# Patient Record
Sex: Female | Born: 1970 | Race: Black or African American | Hispanic: No | Marital: Single | Smoking: Never smoker
Health system: Southern US, Community
[De-identification: ages and names within clinical notes are randomized; demographics above are authoritative.]

## PROBLEM LIST (undated history)

## (undated) DIAGNOSIS — E119 Type 2 diabetes mellitus without complications: Secondary | ICD-10-CM

## (undated) HISTORY — PX: CHOLECYSTECTOMY: SHX55

---

## 2005-02-15 ENCOUNTER — Emergency Department: Payer: Self-pay | Admitting: Emergency Medicine

## 2005-03-24 ENCOUNTER — Emergency Department: Payer: Self-pay | Admitting: Emergency Medicine

## 2005-06-15 ENCOUNTER — Other Ambulatory Visit: Payer: Self-pay

## 2005-06-15 ENCOUNTER — Emergency Department: Payer: Self-pay | Admitting: Internal Medicine

## 2005-12-04 ENCOUNTER — Emergency Department: Payer: Self-pay | Admitting: Emergency Medicine

## 2006-01-20 ENCOUNTER — Ambulatory Visit: Payer: Self-pay | Admitting: Family Medicine

## 2006-08-22 ENCOUNTER — Emergency Department: Payer: Self-pay | Admitting: Emergency Medicine

## 2006-10-04 ENCOUNTER — Emergency Department: Payer: Self-pay | Admitting: Emergency Medicine

## 2007-11-07 ENCOUNTER — Other Ambulatory Visit: Payer: Self-pay

## 2007-11-08 ENCOUNTER — Inpatient Hospital Stay: Payer: Self-pay | Admitting: Surgery

## 2009-02-17 ENCOUNTER — Ambulatory Visit: Payer: Self-pay | Admitting: Family Medicine

## 2009-05-08 ENCOUNTER — Emergency Department: Payer: Self-pay | Admitting: Emergency Medicine

## 2009-11-09 ENCOUNTER — Emergency Department: Payer: Self-pay | Admitting: Emergency Medicine

## 2010-03-09 ENCOUNTER — Inpatient Hospital Stay: Payer: Self-pay | Admitting: Surgery

## 2010-06-24 ENCOUNTER — Emergency Department: Payer: Self-pay | Admitting: Emergency Medicine

## 2011-06-24 ENCOUNTER — Emergency Department: Payer: Self-pay | Admitting: Emergency Medicine

## 2011-06-24 LAB — CBC
HCT: 42.1 % (ref 35.0–47.0)
MCV: 81 fL (ref 80–100)
RBC: 5.22 10*6/uL — ABNORMAL HIGH (ref 3.80–5.20)
RDW: 13.1 % (ref 11.5–14.5)
WBC: 9.7 10*3/uL (ref 3.6–11.0)

## 2011-06-24 LAB — URINALYSIS, COMPLETE
Bilirubin,UR: NEGATIVE
Glucose,UR: >=500 mg/dL (ref 0–75)
Nitrite: POSITIVE
Specific Gravity: 1.021 (ref 1.003–1.030)
Squamous Epithelial: NONE SEEN
WBC UR: 740 /HPF (ref 0–5)

## 2011-06-24 LAB — COMPREHENSIVE METABOLIC PANEL
Albumin: 3.7 g/dL (ref 3.4–5.0)
Alkaline Phosphatase: 104 U/L (ref 50–136)
Anion Gap: 13 (ref 7–16)
BUN: 9 mg/dL (ref 7–18)
Calcium, Total: 9.5 mg/dL (ref 8.5–10.1)
Creatinine: 0.76 mg/dL (ref 0.60–1.30)
EGFR (Non-African Amer.): >60
Osmolality: 288 (ref 275–301)
Potassium: 3.9 mmol/L (ref 3.5–5.1)
Sodium: 139 mmol/L (ref 136–145)
Total Protein: 9.1 g/dL — ABNORMAL HIGH (ref 6.4–8.2)

## 2011-06-24 LAB — LIPASE, BLOOD: Lipase: 143 U/L (ref 73–393)

## 2012-01-20 ENCOUNTER — Emergency Department: Payer: Self-pay | Admitting: Internal Medicine

## 2015-08-03 ENCOUNTER — Emergency Department
Admission: EM | Admit: 2015-08-03 | Discharge: 2015-08-03 | Disposition: A | Discharge status: Home / Self Care | Payer: Medicaid Other | Attending: Emergency Medicine | Admitting: Emergency Medicine

## 2015-08-03 ENCOUNTER — Encounter: Payer: Self-pay | Admitting: *Deleted

## 2015-08-03 DIAGNOSIS — Z3202 Encounter for pregnancy test, result negative: Secondary | ICD-10-CM | POA: Insufficient documentation

## 2015-08-03 DIAGNOSIS — N390 Urinary tract infection, site not specified: Secondary | ICD-10-CM

## 2015-08-03 DIAGNOSIS — K2970 Gastritis, unspecified, without bleeding: Secondary | ICD-10-CM

## 2015-08-03 DIAGNOSIS — E119 Type 2 diabetes mellitus without complications: Secondary | ICD-10-CM | POA: Diagnosis not present

## 2015-08-03 DIAGNOSIS — R112 Nausea with vomiting, unspecified: Secondary | ICD-10-CM | POA: Diagnosis present

## 2015-08-03 HISTORY — DX: Type 2 diabetes mellitus without complications: E11.9

## 2015-08-03 LAB — COMPREHENSIVE METABOLIC PANEL
ALT: 13 U/L — ABNORMAL LOW (ref 14–54)
ANION GAP: 11 (ref 5–15)
AST: 25 U/L (ref 15–41)
Albumin: 3.7 g/dL (ref 3.5–5.0)
Alkaline Phosphatase: 97 U/L (ref 38–126)
BILIRUBIN TOTAL: 0.9 mg/dL (ref 0.3–1.2)
BUN: 12 mg/dL (ref 6–20)
CHLORIDE: 104 mmol/L (ref 101–111)
CO2: 20 mmol/L — ABNORMAL LOW (ref 22–32)
Calcium: 8.9 mg/dL (ref 8.9–10.3)
Creatinine, Ser: 0.75 mg/dL (ref 0.44–1.00)
Glucose, Bld: 206 mg/dL — ABNORMAL HIGH (ref 65–99)
POTASSIUM: 4.4 mmol/L (ref 3.5–5.1)
Sodium: 135 mmol/L (ref 135–145)
TOTAL PROTEIN: 8.5 g/dL — AB (ref 6.5–8.1)

## 2015-08-03 LAB — URINALYSIS COMPLETE WITH MICROSCOPIC (ARMC ONLY)
BACTERIA UA: NONE SEEN
Bilirubin Urine: NEGATIVE
GLUCOSE, UA: 150 mg/dL — AB
LEUKOCYTES UA: NEGATIVE
NITRITE: NEGATIVE
SPECIFIC GRAVITY, URINE: 1.032 — AB (ref 1.005–1.030)
pH: 5 (ref 5.0–8.0)

## 2015-08-03 LAB — CBC
HEMATOCRIT: 43.9 % (ref 35.0–47.0)
HEMOGLOBIN: 14.6 g/dL (ref 12.0–16.0)
MCH: 25.7 pg — ABNORMAL LOW (ref 26.0–34.0)
MCHC: 33.4 g/dL (ref 32.0–36.0)
MCV: 76.9 fL — AB (ref 80.0–100.0)
Platelets: 290 10*3/uL (ref 150–440)
RBC: 5.7 MIL/uL — AB (ref 3.80–5.20)
RDW: 13.7 % (ref 11.5–14.5)
WBC: 9.5 10*3/uL (ref 3.6–11.0)

## 2015-08-03 LAB — POCT PREGNANCY, URINE: PREG TEST UR: NEGATIVE

## 2015-08-03 LAB — LIPASE, BLOOD: LIPASE: 20 U/L (ref 11–51)

## 2015-08-03 MED ORDER — ONDANSETRON HCL 4 MG/2ML IJ SOLN
4.0000 mg | Freq: Once | INTRAMUSCULAR | Status: AC | PRN
  Administered 2015-08-03: 4 mg via INTRAVENOUS

## 2015-08-03 MED ORDER — SODIUM CHLORIDE 0.9 % IV BOLUS (SEPSIS)
1000.0000 mL | Freq: Once | INTRAVENOUS | Status: AC
  Administered 2015-08-03: 1000 mL via INTRAVENOUS

## 2015-08-03 MED ORDER — ONDANSETRON HCL 4 MG PO TABS: 4.0000 mg | ORAL_TABLET | Freq: Every day | ORAL | Status: DC | PRN

## 2015-08-03 MED ORDER — ONDANSETRON HCL 4 MG/2ML IJ SOLN
INTRAMUSCULAR | Status: AC
  Administered 2015-08-03: 4 mg via INTRAVENOUS
  Filled 2015-08-03: qty 2

## 2015-08-03 MED ORDER — ONDANSETRON HCL 4 MG/2ML IJ SOLN
INTRAMUSCULAR | Status: AC
  Filled 2015-08-03: qty 2

## 2015-08-03 MED ORDER — CIPROFLOXACIN HCL 500 MG PO TABS: 500.0000 mg | ORAL_TABLET | Freq: Two times a day (BID) | ORAL | Status: DC

## 2015-08-03 NOTE — ED Notes (Signed)
20 G removed fron left AC, catheter removed intact, bandage placed to control bleeding.

## 2015-08-03 NOTE — ED Notes (Signed)
Patient given gingerale and crackers per Dr. Cyril Loosen.

## 2015-08-03 NOTE — Discharge Instructions (Signed)
Gastritis, Adult °Gastritis is soreness and swelling (inflammation) of the lining of the stomach. Gastritis can develop as a sudden onset (acute) or long-term (chronic) condition. If gastritis is not treated, it can lead to stomach bleeding and ulcers. °CAUSES  °Gastritis occurs when the stomach lining is weak or damaged. Digestive juices from the stomach then inflame the weakened stomach lining. The stomach lining may be weak or damaged due to viral or bacterial infections. One common bacterial infection is the Helicobacter pylori infection. Gastritis can also result from excessive alcohol consumption, taking certain medicines, or having too much acid in the stomach.  °SYMPTOMS  °In some cases, there are no symptoms. When symptoms are present, they may include: °· Pain or a burning sensation in the upper abdomen. °· Nausea. °· Vomiting. °· An uncomfortable feeling of fullness after eating. °DIAGNOSIS  °Your caregiver may suspect you have gastritis based on your symptoms and a physical exam. To determine the cause of your gastritis, your caregiver may perform the following: °· Blood or stool tests to check for the H pylori bacterium. °· Gastroscopy. A thin, flexible tube (endoscope) is passed down the esophagus and into the stomach. The endoscope has a light and camera on the end. Your caregiver uses the endoscope to view the inside of the stomach. °· Taking a tissue sample (biopsy) from the stomach to examine under a microscope. °TREATMENT  °Depending on the cause of your gastritis, medicines may be prescribed. If you have a bacterial infection, such as an H pylori infection, antibiotics may be given. If your gastritis is caused by too much acid in the stomach, H2 blockers or antacids may be given. Your caregiver may recommend that you stop taking aspirin, ibuprofen, or other nonsteroidal anti-inflammatory drugs (NSAIDs). °HOME CARE INSTRUCTIONS °· Only take over-the-counter or prescription medicines as directed by  your caregiver. °· If you were given antibiotic medicines, take them as directed. Finish them even if you start to feel better. °· Drink enough fluids to keep your urine clear or pale yellow. °· Avoid foods and drinks that make your symptoms worse, such as: °· Caffeine or alcoholic drinks. °· Chocolate. °· Peppermint or mint flavorings. °· Garlic and onions. °· Spicy foods. °· Citrus fruits, such as oranges, lemons, or limes. °· Tomato-based foods such as sauce, chili, salsa, and pizza. °· Fried and fatty foods. °· Eat small, frequent meals instead of large meals. °SEEK IMMEDIATE MEDICAL CARE IF:  °· You have black or dark red stools. °· You vomit blood or material that looks like coffee grounds. °· You are unable to keep fluids down. °· Your abdominal pain gets worse. °· You have a fever. °· You do not feel better after 1 week. °· You have any other questions or concerns. °MAKE SURE YOU: °· Understand these instructions. °· Will watch your condition. °· Will get help right away if you are not doing well or get worse. °  °This information is not intended to replace advice given to you by your health care provider. Make sure you discuss any questions you have with your health care provider. °  °Document Released: 05/17/2001 Document Revised: 11/22/2011 Document Reviewed: 07/06/2011 °Elsevier Interactive Patient Education ©2016 [employer] ° °Urinary Tract Infection °Urinary tract infections (UTIs) can develop anywhere along your urinary tract. Your urinary tract is your body's drainage system for removing wastes and extra water. Your urinary tract includes two kidneys, two ureters, a bladder, and a urethra. Your kidneys are a pair of bean-shaped organs.   Each kidney is about the size of your fist. They are located below your ribs, one on each side of your spine. °CAUSES °Infections are caused by microbes, which are microscopic organisms, including fungi, viruses, and bacteria. These organisms are so small that they  can only be seen through a microscope. Bacteria are the microbes that most commonly cause UTIs. °SYMPTOMS  °Symptoms of UTIs may vary by age and gender of the patient and by the location of the infection. Symptoms in young women typically include a frequent and intense urge to urinate and a painful, burning feeling in the bladder or urethra during urination. Older women and men are more likely to be tired, shaky, and weak and have muscle aches and abdominal pain. A fever may mean the infection is in your kidneys. Other symptoms of a kidney infection include pain in your back or sides below the ribs, nausea, and vomiting. °DIAGNOSIS °To diagnose a UTI, your caregiver will ask you about your symptoms. Your caregiver will also ask you to provide a urine sample. The urine sample will be tested for bacteria and white blood cells. White blood cells are made by your body to help fight infection. °TREATMENT  °Typically, UTIs can be treated with medication. Because most UTIs are caused by a bacterial infection, they usually can be treated with the use of antibiotics. The choice of antibiotic and length of treatment depend on your symptoms and the type of bacteria causing your infection. °HOME CARE INSTRUCTIONS °· If you were prescribed antibiotics, take them exactly as your caregiver instructs you. Finish the medication even if you feel better after you have only taken some of the medication. °· Drink enough water and fluids to keep your urine clear or pale yellow. °· Avoid caffeine, tea, and carbonated beverages. They tend to irritate your bladder. °· Empty your bladder often. Avoid holding urine for long periods of time. °· Empty your bladder before and after sexual intercourse. °· After a bowel movement, women should cleanse from front to back. Use each tissue only once. °SEEK MEDICAL CARE IF:  °· You have back pain. °· You develop a fever. °· Your symptoms do not begin to resolve within 3 days. °SEEK IMMEDIATE MEDICAL  CARE IF:  °· You have severe back pain or lower abdominal pain. °· You develop chills. °· You have nausea or vomiting. °· You have continued burning or discomfort with urination. °MAKE SURE YOU:  °· Understand these instructions. °· Will watch your condition. °· Will get help right away if you are not doing well or get worse. °  °This information is not intended to replace advice given to you by your health care provider. Make sure you discuss any questions you have with your health care provider. °  °Document Released: 03/02/2005 Document Revised: 02/11/2015 Document Reviewed: 07/01/2011 °Elsevier Interactive Patient Education ©2016 [employer] ° °

## 2015-08-03 NOTE — ED Provider Notes (Signed)
[facility] Emergency Department Provider Note  ____________________________________________    I have reviewed the triage vital signs and the nursing notes.   HISTORY  Chief Complaint Nausea and Emesis    HPI TAKEYLA MILLION is a 45 y.o. female who presents with complaints of nausea and vomiting and mild upper abdominal cramping for the last 2 days. She denies sick contacts. No fevers or chills. She is not been able tolerate by mouth's. She denies diarrhea. No recent travel. Eating makes her symptoms worse     Past Medical History  Diagnosis Date  . Diabetes mellitus without complication (HCC)     There are no active problems to display for this patient.   History reviewed. No pertinent past surgical history.  Current Outpatient Rx  Name  Route  Sig  Dispense  Refill  . ciprofloxacin (CIPRO) 500 MG tablet   Oral   Take 1 tablet (500 mg total) by mouth 2 (two) times daily.   14 tablet   0   . ondansetron (ZOFRAN) 4 MG tablet   Oral   Take 1 tablet (4 mg total) by mouth daily as needed for nausea or vomiting.   20 tablet   1     Allergies Review of patient's allergies indicates no known allergies.  History reviewed. No pertinent family history.  Social History Social History  Substance Use Topics  . Smoking status: Never Smoker   . Smokeless tobacco: None  . Alcohol Use: None    Review of Systems  Constitutional: Negative for fever. Eyes: Negative for visual changes. ENT: Negative for sore throat Cardiovascular: Negative for chest pain. Respiratory: Negative for cough Gastrointestinal: Negative for  diarrhea. Genitourinary: Negative for dysuria. Musculoskeletal: Negative for back pain. Skin: Negative for rash. Neurological: Negative for headaches  Psychiatric: No anxiety    ____________________________________________   PHYSICAL EXAM:  VITAL SIGNS: ED Triage Vitals  Enc Vitals Group     BP 08/03/15 1804 121/74  mmHg     Pulse Rate 08/03/15 1804 102     Resp 08/03/15 1804 18     Temp 08/03/15 1804 98.6 F (37 C)     Temp Source 08/03/15 1804 Oral     SpO2 08/03/15 1804 98 %     Weight 08/03/15 1804 170 lb (77.111 kg)     Height 08/03/15 1804  (1.6 m)     Head Cir --      Peak Flow --      Pain Score 08/03/15 1814 5     Pain Loc --      Pain Edu? --      Excl. in GC? --      Constitutional: Alert and oriented. Well appearing and in no distress. Eyes: Conjunctivae are normal.  ENT   Head: Normocephalic and atraumatic.   Mouth/Throat: Mucous membranes are moist. Cardiovascular: Normal rate, regular rhythm.  Respiratory: Normal respiratory effort without tachypnea nor retractions.  Gastrointestinal: Soft and non-tender in all quadrants. No distention. There is no CVA tenderness. Genitourinary: deferred Musculoskeletal: Nontender with normal range of motion in all extremities. No lower extremity tenderness nor edema. Neurologic:  Normal speech and language. No gross focal neurologic deficits are appreciated. Skin:  Skin is warm, dry and intact. No rash noted. Psychiatric: Mood and affect are normal. Patient exhibits appropriate insight and judgment.  ____________________________________________    LABS (pertinent positives/negatives)  Labs Reviewed  COMPREHENSIVE METABOLIC PANEL - Abnormal; Notable for the following:    CO2 20 (*)  Glucose, Bld 206 (*)    Total Protein 8.5 (*)    ALT 13 (*)    All other components within normal limits  CBC - Abnormal; Notable for the following:    RBC 5.70 (*)    MCV 76.9 (*)    MCH 25.7 (*)    All other components within normal limits  URINALYSIS COMPLETEWITH MICROSCOPIC ([facility] ONLY) - Abnormal; Notable for the following:    Color, Urine AMBER (*)    APPearance CLOUDY (*)    Glucose, UA 150 (*)    Ketones, ur 1+ (*)    Specific Gravity, Urine 1.032 (*)    Hgb urine dipstick 2+ (*)    Protein, ur >500 (*)    Squamous  Epithelial / LPF 6-30 (*)    All other components within normal limits  LIPASE, BLOOD  POCT PREGNANCY, URINE    ____________________________________________   EKG  None  ____________________________________________    RADIOLOGY I have personally reviewed any xrays that were ordered on this patient: None  ____________________________________________   PROCEDURES  Procedure(s) performed: none  Critical Care performed: none  ____________________________________________   INITIAL IMPRESSION / ASSESSMENT AND PLAN / ED COURSE  Pertinent labs & imaging results that were available during my care of the patient were reviewed by me and considered in my medical decision making (see chart for details).  Patient with benign abdominal exam. Suspect gastritis as the cause of her nausea vomiting. We will give IV fluids and Zofran.  Labs are overall reassuring. Possible early urinary tract infection versus contamination. She feels much better after IV Zofran and fluids.  She is tolerating by mouth's.  Discharge with outpatient follow-up and return precautions are given    ____________________________________________   FINAL CLINICAL IMPRESSION(S) / ED DIAGNOSES  Final diagnoses:  Gastritis  UTI (lower urinary tract infection)     Jene Every, MD 08/03/15 2116

## 2015-08-03 NOTE — ED Notes (Signed)

## 2015-08-03 NOTE — ED Notes (Signed)
No vomiting noted after PO challenge.

## 2015-08-03 NOTE — ED Notes (Signed)
States nausea and vomiting since Saturday, states mid abd pain

## 2015-08-04 ENCOUNTER — Encounter: Payer: Self-pay | Admitting: Emergency Medicine

## 2015-08-04 ENCOUNTER — Emergency Department
Admission: EM | Admit: 2015-08-04 | Discharge: 2015-08-04 | Disposition: A | Discharge status: Home / Self Care | Payer: Medicaid Other | Attending: Emergency Medicine | Admitting: Emergency Medicine

## 2015-08-04 DIAGNOSIS — E119 Type 2 diabetes mellitus without complications: Secondary | ICD-10-CM | POA: Diagnosis not present

## 2015-08-04 DIAGNOSIS — R1010 Upper abdominal pain, unspecified: Secondary | ICD-10-CM | POA: Insufficient documentation

## 2015-08-04 DIAGNOSIS — R112 Nausea with vomiting, unspecified: Secondary | ICD-10-CM

## 2015-08-04 LAB — COMPREHENSIVE METABOLIC PANEL
ALBUMIN: 3.5 g/dL (ref 3.5–5.0)
ALT: 16 U/L (ref 14–54)
AST: 25 U/L (ref 15–41)
Alkaline Phosphatase: 91 U/L (ref 38–126)
Anion gap: 9 (ref 5–15)
BUN: 12 mg/dL (ref 6–20)
CHLORIDE: 104 mmol/L (ref 101–111)
CO2: 23 mmol/L (ref 22–32)
Calcium: 8.8 mg/dL — ABNORMAL LOW (ref 8.9–10.3)
Creatinine, Ser: 0.83 mg/dL (ref 0.44–1.00)
GFR calc Af Amer: >60 mL/min (ref >60)
GFR calc non Af Amer: >60 mL/min (ref >60)
GLUCOSE: 248 mg/dL — AB (ref 65–99)
POTASSIUM: 5 mmol/L (ref 3.5–5.1)
SODIUM: 136 mmol/L (ref 135–145)
Total Bilirubin: 0.7 mg/dL (ref 0.3–1.2)
Total Protein: 8.3 g/dL — ABNORMAL HIGH (ref 6.5–8.1)

## 2015-08-04 LAB — URINALYSIS COMPLETE WITH MICROSCOPIC (ARMC ONLY)
BACTERIA UA: NONE SEEN
Bilirubin Urine: NEGATIVE
GLUCOSE, UA: 50 mg/dL — AB
LEUKOCYTES UA: NEGATIVE
Nitrite: NEGATIVE
Specific Gravity, Urine: 1.032 — ABNORMAL HIGH (ref 1.005–1.030)
pH: 5 (ref 5.0–8.0)

## 2015-08-04 LAB — CBC
HEMATOCRIT: 41.3 % (ref 35.0–47.0)
Hemoglobin: 14.1 g/dL (ref 12.0–16.0)
MCH: 25.7 pg — ABNORMAL LOW (ref 26.0–34.0)
MCHC: 34.1 g/dL (ref 32.0–36.0)
MCV: 75.3 fL — AB (ref 80.0–100.0)
Platelets: 299 10*3/uL (ref 150–440)
RBC: 5.48 MIL/uL — ABNORMAL HIGH (ref 3.80–5.20)
RDW: 13.7 % (ref 11.5–14.5)
WBC: 7.5 10*3/uL (ref 3.6–11.0)

## 2015-08-04 LAB — LIPASE, BLOOD: LIPASE: 20 U/L (ref 11–51)

## 2015-08-04 MED ORDER — ONDANSETRON HCL 4 MG/2ML IJ SOLN
4.0000 mg | Freq: Once | INTRAMUSCULAR | Status: AC
  Administered 2015-08-04: 4 mg via INTRAVENOUS

## 2015-08-04 MED ORDER — PROMETHAZINE HCL 25 MG/ML IJ SOLN
12.5000 mg | Freq: Once | INTRAMUSCULAR | Status: AC
  Administered 2015-08-04: 12.5 mg via INTRAVENOUS
  Filled 2015-08-04: qty 1

## 2015-08-04 MED ORDER — SODIUM CHLORIDE 0.9 % IV BOLUS (SEPSIS)
1000.0000 mL | Freq: Once | INTRAVENOUS | Status: AC
  Administered 2015-08-04: 1000 mL via INTRAVENOUS

## 2015-08-04 MED ORDER — ONDANSETRON HCL 4 MG/2ML IJ SOLN
INTRAMUSCULAR | Status: AC
  Filled 2015-08-04: qty 2

## 2015-08-04 MED ORDER — METOCLOPRAMIDE HCL 10 MG PO TABS
10.0000 mg | ORAL_TABLET | Freq: Three times a day (TID) | ORAL | Status: DC | PRN
Start: 2015-08-04 — End: 2020-07-10

## 2015-08-04 MED ORDER — METOCLOPRAMIDE HCL 5 MG/ML IJ SOLN
10.0000 mg | Freq: Once | INTRAMUSCULAR | Status: AC
Start: 2015-08-04 — End: 2015-08-04
  Administered 2015-08-04: 10 mg via INTRAVENOUS
  Filled 2015-08-04: qty 2

## 2015-08-04 MED ORDER — INSULIN ASPART 100 UNIT/ML ~~LOC~~ SOLN
5.0000 [IU] | Freq: Once | SUBCUTANEOUS | Status: AC
  Administered 2015-08-04: 5 [IU] via SUBCUTANEOUS
  Filled 2015-08-04: qty 5

## 2015-08-04 NOTE — ED Provider Notes (Signed)
[facility] Emergency Department Provider Note     Time seen: ----------------------------------------- 8:24 PM on 08/04/2015 -----------------------------------------    I have reviewed the triage vital signs and the nursing notes.   HISTORY  Chief Complaint Nausea and Emesis    HPI Brandi Oneal is a 45 y.o. female who presents the ER for nausea and vomiting. Patient was seen here yesterday for same, diagnosed with UTI and gastroenteritis. Patient states symptoms have not really improved, Zofran was last taken at 3:30 PM. Patient states her blood sugar has been a little elevated, eating makes her symptoms worse. She's not had any diarrhea but has had upper abdominal cramping.   Past Medical History  Diagnosis Date  . Diabetes mellitus without complication (HCC)     There are no active problems to display for this patient.   History reviewed. No pertinent past surgical history.  Allergies Review of patient's allergies indicates no known allergies.  Social History Social History  Substance Use Topics  . Smoking status: Never Smoker   . Smokeless tobacco: None  . Alcohol Use: None    Review of Systems Constitutional: Negative for fever. Eyes: Negative for visual changes. ENT: Negative for sore throat. Cardiovascular: Negative for chest pain. Respiratory: Negative for shortness of breath. Gastrointestinal: Positive for abdominal pain and vomiting Genitourinary: Negative for dysuria. Musculoskeletal: Negative for back pain. Skin: Negative for rash. Neurological: Negative for headaches, focal weakness or numbness.  10-point ROS otherwise negative.  ____________________________________________   PHYSICAL EXAM:  VITAL SIGNS: ED Triage Vitals  Enc Vitals Group     BP 08/04/15 1727 143/103 mmHg     Pulse Rate 08/04/15 1727 106     Resp 08/04/15 1727 17     Temp 08/04/15 1727 98.2 F (36.8 C)     Temp Source 08/04/15 1727 Oral      SpO2 08/04/15 1727 97 %     Weight 08/04/15 1727 170 lb (77.111 kg)     Height 08/04/15 1727  (1.575 m)     Head Cir --      Peak Flow --      Pain Score 08/04/15 1731 6     Pain Loc --      Pain Edu? --      Excl. in GC? --     Constitutional: Alert and oriented. Well appearing and in no distress. Eyes: Conjunctivae are normal. PERRL. Normal extraocular movements. ENT   Head: Normocephalic and atraumatic.   Nose: No congestion/rhinnorhea.   Mouth/Throat: Mucous membranes are moist.   Neck: No stridor. Cardiovascular: Normal rate, regular rhythm. Normal and symmetric distal pulses are present in all extremities. No murmurs, rubs, or gallops. Respiratory: Normal respiratory effort without tachypnea nor retractions. Breath sounds are clear and equal bilaterally. No wheezes/rales/rhonchi. Gastrointestinal: Mild upper abdominal tenderness, no rebound or guarding. Normal bowel sounds. Musculoskeletal: Nontender with normal range of motion in all extremities. No joint effusions.  No lower extremity tenderness nor edema. Neurologic:  Normal speech and language. No gross focal neurologic deficits are appreciated. Speech is normal. No gait instability. Skin:  Skin is warm, dry and intact. No rash noted. Psychiatric: Mood and affect are normal. Speech and behavior are normal. Patient exhibits appropriate insight and judgment. ____________________________________________  ED COURSE:  Pertinent labs & imaging results that were available during my care of the patient were reviewed by me and considered in my medical decision making (see chart for details). Patient is in no acute distress, I'll recheck  a sig labs give IV fluids and antiemetics. ____________________________________________    LABS (pertinent positives/negatives)  Labs Reviewed  COMPREHENSIVE METABOLIC PANEL - Abnormal; Notable for the following:    Glucose, Bld 248 (*)    Calcium 8.8 (*)    Total Protein  8.3 (*)    All other components within normal limits  CBC - Abnormal; Notable for the following:    RBC 5.48 (*)    MCV 75.3 (*)    MCH 25.7 (*)    All other components within normal limits  URINALYSIS COMPLETEWITH MICROSCOPIC ([facility] ONLY) - Abnormal; Notable for the following:    Color, Urine AMBER (*)    APPearance HAZY (*)    Glucose, UA 50 (*)    Ketones, ur 2+ (*)    Specific Gravity, Urine 1.032 (*)    Hgb urine dipstick 1+ (*)    Protein, ur >500 (*)    Squamous Epithelial / LPF 0-5 (*)    All other components within normal limits  LIPASE, BLOOD   ____________________________________________  FINAL ASSESSMENT AND PLAN  Abdominal pain, vomiting  Plan: Patient with labs as dictated above. Patient is currently feeling better, has been able to tolerate food and drink by mouth. She'll be discharged with Reglan and encouraged to have close follow-up with her doctor for recheck.   Emily Filbert, MD   Emily Filbert, MD 08/04/15 [phone]

## 2015-08-04 NOTE — ED Notes (Signed)
C/o nausea/ vomiting.  Seen yesterday for same.  Diagnosed with UTI and gastroenteritis.  Patient states symptoms have not improved.  Zofran last taken at 1530.

## 2015-08-04 NOTE — ED Notes (Signed)
PO Challenge initiated with diet ginger ale and saltine crackers.

## 2015-08-04 NOTE — Discharge Instructions (Signed)

## 2018-10-04 ENCOUNTER — Telehealth: Payer: Self-pay | Admitting: Pharmacy Technician

## 2018-10-04 NOTE — Telephone Encounter (Signed)
Patient failed to provide requested financial documentation. Financial documentation is required in order to determine patient's eligibility for [facility]'s program. No additional medication assistance will be provided until patient provides requested financial documentation. Patient notified by letter.  Bridgett Hattabaugh CPhT/Eligibility Specialist [facility]  

## 2019-05-01 ENCOUNTER — Other Ambulatory Visit: Payer: Self-pay

## 2019-05-01 DIAGNOSIS — Z20822 Contact with and (suspected) exposure to covid-19: Secondary | ICD-10-CM

## 2019-05-02 LAB — NOVEL CORONAVIRUS, NAA: SARS-CoV-2, NAA: NOT DETECTED

## 2019-06-05 ENCOUNTER — Ambulatory Visit: Payer: Medicaid Other | Attending: Internal Medicine

## 2019-06-05 DIAGNOSIS — Z20822 Contact with and (suspected) exposure to covid-19: Secondary | ICD-10-CM

## 2019-06-07 LAB — NOVEL CORONAVIRUS, NAA: SARS-CoV-2, NAA: NOT DETECTED

## 2020-07-10 ENCOUNTER — Encounter: Payer: Self-pay | Admitting: Emergency Medicine

## 2020-07-10 ENCOUNTER — Other Ambulatory Visit: Payer: Self-pay

## 2020-07-10 ENCOUNTER — Emergency Department
Admission: EM | Admit: 2020-07-10 | Discharge: 2020-07-10 | Disposition: A | Discharge status: Home / Self Care | Payer: Medicaid Other | Attending: Emergency Medicine | Admitting: Emergency Medicine

## 2020-07-10 ENCOUNTER — Other Ambulatory Visit: Payer: Self-pay | Admitting: Family Medicine

## 2020-07-10 DIAGNOSIS — Z7982 Long term (current) use of aspirin: Secondary | ICD-10-CM | POA: Insufficient documentation

## 2020-07-10 DIAGNOSIS — N644 Mastodynia: Secondary | ICD-10-CM | POA: Diagnosis present

## 2020-07-10 DIAGNOSIS — E119 Type 2 diabetes mellitus without complications: Secondary | ICD-10-CM | POA: Insufficient documentation

## 2020-07-10 DIAGNOSIS — N6310 Unspecified lump in the right breast, unspecified quadrant: Secondary | ICD-10-CM

## 2020-07-10 MED ORDER — NAPROXEN 500 MG PO TABS: 500.0000 mg | ORAL_TABLET | Freq: Two times a day (BID) | ORAL | 0 refills | Status: DC

## 2020-07-10 NOTE — ED Notes (Signed)
See triage note  Presents with pain to right breast  States she felt a lump about 1 week ago

## 2020-07-10 NOTE — ED Triage Notes (Signed)
Pt reports about a week and a half ago she felt a lump or bump on her right breast and since then the breast has been sore.

## 2020-07-10 NOTE — ED Provider Notes (Signed)
[facility] Emergency Department Provider Note  ____________________________________________   Event Date/Time   First MD Initiated Contact with Patient 07/10/20 1005     (approximate)  I have reviewed the triage vital signs and the nursing notes.   HISTORY  Chief Complaint Breast Pain   HPI Brandi Oneal is a 50 y.o. female presents to the ED with complaint of pain to her right breast for approximately 1 week.  Patient states that she felt a "lump" 1 week ago.  This area has stayed the same in size.  She denies any discharge from her nipple, redness, heat, injury or previous breast issues.  Patient states she has never had a mammogram.  Currently she goes to [facility] and sees Dr. Letta Pate.  Currently she rates her pain as a 9 out of 10.       Past Medical History:  Diagnosis Date  . Diabetes mellitus without complication (HCC)     There are no problems to display for this patient.   History reviewed. No pertinent surgical history.  Prior to Admission medications   Medication Sig Start Date End Date Taking? Authorizing Provider  aspirin 81 MG chewable tablet Chew 81 mg by mouth daily.   Yes [provider]  atorvastatin (LIPITOR) 40 MG tablet Take 40 mg by mouth daily.   Yes [provider]  liraglutide (VICTOZA) 18 MG/3ML SOPN Inject into the skin.   Yes [provider]  lisinopril (ZESTRIL) 2.5 MG tablet Take 2.5 mg by mouth daily.   Yes [provider]  naproxen (NAPROSYN) 500 MG tablet Take 1 tablet (500 mg total) by mouth 2 (two) times daily with a meal. 07/10/20  Yes Tommi Rumps, PA-C    Allergies Patient has no known allergies.  No family history on file.  Social History Social History   Tobacco Use  . Smoking status: Never Smoker    Review of Systems Constitutional: No fever/chills Eyes: No visual changes. Cardiovascular: Denies chest pain. Respiratory: Denies shortness of  breath. Gastrointestinal: No abdominal pain.  No nausea, no vomiting.   Genitourinary: Negative for dysuria. Musculoskeletal: Negative for back pain. Skin: Negative for rash.  Positive right breast "lump". Neurological: Negative for headaches, focal weakness or numbness.  ____________________________________________   PHYSICAL EXAM:  VITAL SIGNS: ED Triage Vitals  Enc Vitals Group     BP 07/10/20 0936 (!) 153/94     Pulse Rate 07/10/20 0936 (!) 104     Resp --      Temp 07/10/20 0936 98 F (36.7 C)     Temp Source 07/10/20 0936 Oral     SpO2 07/10/20 0936 97 %     Weight 07/10/20 0936 180 lb (81.6 kg)     Height 07/10/20 0936 5\' 2"  (1.575 m)     Head Circumference --      Peak Flow --      Pain Score 07/10/20 0939 9     Pain Loc --      Pain Edu? --      Excl. in GC? --     Constitutional: Alert and oriented. Well appearing and in no acute distress. Eyes: Conjunctivae are normal.  Head: Atraumatic. Neck: No stridor.   Hematological/Lymphatic/Immunilogical: No cervical lymphadenopathy. Cardiovascular: Normal rate, regular rhythm. Grossly normal heart sounds.  Good peripheral circulation. Respiratory: Normal respiratory effort.  No retractions. Lungs CTAB. Musculoskeletal: Moves upper and lower extremities without any difficulty and normal gait was noted. Neurologic:  Normal speech and language. No gross focal neurologic deficits are appreciated.  Skin:  Skin is warm, dry and intact.  On examination of the right breast there is no gross deformity or dimpling noted.  There is no discharge from the nipple.  There is a 2 cm mobile nodule at approximately 9:00 area that is easily moved.  There are also several other nodules noted during exam again with no dimpling.  No lymphadenopathy noted to the axilla.  No skin discoloration.  Physical exam is more consistent with fibrocystic breast disease. Psychiatric: Mood and affect are normal. Speech and behavior are  normal.  ____________________________________________   LABS (all labs ordered are listed, but only abnormal results are displayed)  Labs Reviewed - No data to display   PROCEDURES  Procedure(s) performed (including Critical Care):  Procedures   ____________________________________________   INITIAL IMPRESSION / ASSESSMENT AND PLAN / ED COURSE  As part of my medical decision making, I reviewed the following data within the electronic MEDICAL RECORD NUMBER Notes from prior ED visits and  Controlled Substance Database  50 year old female presents to the ED with complaint of right breast pain and finding a lump 1 week ago.  Patient states that there has been no dimpling or discharge from her nipple.  She denies any fever, chills or warmth to her breasts.  No erythema noted on exam.  There are several mobile nodules noted during exam.  The one felt by the patient is approximately at 9 o'clock position and is easily moved.  No dimpling is noted in this area.  I spoke with [facility] about a mammography for this patient.  She will need to contact Dr. Letta Pate at [facility] to have this ordered as a diagnostic test so that results can go to his office.  Patient was given a prescription for naproxen 500 mg twice daily.  We also discussed discontinuing or decreasing the amount of caffeine that she is drinking.  There is no family history of breast cancer which is also reassuring.  ____________________________________________   FINAL CLINICAL IMPRESSION(S) / ED DIAGNOSES  Final diagnoses:  Breast pain, right     ED Discharge Orders         Ordered    naproxen (NAPROSYN) 500 MG tablet  2 times daily with meals        07/10/20 1037          *Please note:  Brandi Oneal was evaluated in Emergency Department on 07/10/2020 for the symptoms described in the history of present illness. She was evaluated in the context of the global COVID-19 pandemic, which necessitated  consideration that the patient might be at risk for infection with the SARS-CoV-2 virus that causes COVID-19. Institutional protocols and algorithms that pertain to the evaluation of patients at risk for COVID-19 are in a state of rapid change based on information released by regulatory bodies including the CDC and federal and state organizations. These policies and algorithms were followed during the patient's care in the ED.  Some ED evaluations and interventions may be delayed as a result of limited staffing during and the pandemic.*   Note:  This document was prepared using Dragon voice recognition software and may include unintentional dictation errors.    Tommi Rumps, PA-C 07/10/20 1333    Gilles Chiquito, MD 07/10/20 [phone]

## 2020-07-10 NOTE — Discharge Instructions (Addendum)
Call your primary care provider to get an order for mammogram.  They will then send the results to your primary care provider.  In the meantime decrease the amount of caffeine that you are drinking and also start naproxen 5 mg twice daily with food.

## 2020-07-13 ENCOUNTER — Other Ambulatory Visit: Payer: Self-pay | Admitting: Family Medicine

## 2020-07-13 DIAGNOSIS — N6310 Unspecified lump in the right breast, unspecified quadrant: Secondary | ICD-10-CM

## 2020-07-22 ENCOUNTER — Other Ambulatory Visit: Payer: Self-pay

## 2020-07-22 ENCOUNTER — Ambulatory Visit
Admission: RE | Admit: 2020-07-22 | Discharge: 2020-07-22 | Disposition: A | Discharge status: Home / Self Care | Payer: Medicaid Other | Source: Ambulatory Visit | Attending: Family Medicine | Admitting: Family Medicine

## 2020-07-22 DIAGNOSIS — N6310 Unspecified lump in the right breast, unspecified quadrant: Secondary | ICD-10-CM

## 2020-07-22 DIAGNOSIS — N6311 Unspecified lump in the right breast, upper outer quadrant: Secondary | ICD-10-CM | POA: Insufficient documentation

## 2020-12-01 ENCOUNTER — Emergency Department: Payer: Medicare Other

## 2020-12-01 ENCOUNTER — Encounter: Payer: Self-pay | Admitting: Emergency Medicine

## 2020-12-01 ENCOUNTER — Inpatient Hospital Stay
Admission: EM | Admit: 2020-12-01 | Discharge: 2020-12-04 | DRG: 440 | Disposition: A | Discharge status: Home / Self Care | Payer: Medicare Other | Attending: Internal Medicine | Admitting: Internal Medicine

## 2020-12-01 ENCOUNTER — Other Ambulatory Visit: Payer: Self-pay

## 2020-12-01 DIAGNOSIS — Z7982 Long term (current) use of aspirin: Secondary | ICD-10-CM

## 2020-12-01 DIAGNOSIS — K859 Acute pancreatitis without necrosis or infection, unspecified: Secondary | ICD-10-CM | POA: Diagnosis not present

## 2020-12-01 DIAGNOSIS — K7689 Other specified diseases of liver: Secondary | ICD-10-CM | POA: Diagnosis present

## 2020-12-01 DIAGNOSIS — Z79899 Other long term (current) drug therapy: Secondary | ICD-10-CM

## 2020-12-01 DIAGNOSIS — E119 Type 2 diabetes mellitus without complications: Secondary | ICD-10-CM

## 2020-12-01 DIAGNOSIS — Z794 Long term (current) use of insulin: Secondary | ICD-10-CM

## 2020-12-01 DIAGNOSIS — Z20822 Contact with and (suspected) exposure to covid-19: Secondary | ICD-10-CM | POA: Diagnosis present

## 2020-12-01 DIAGNOSIS — D72829 Elevated white blood cell count, unspecified: Secondary | ICD-10-CM | POA: Diagnosis present

## 2020-12-01 DIAGNOSIS — R739 Hyperglycemia, unspecified: Secondary | ICD-10-CM | POA: Diagnosis present

## 2020-12-01 DIAGNOSIS — R000 Tachycardia, unspecified: Secondary | ICD-10-CM | POA: Diagnosis present

## 2020-12-01 DIAGNOSIS — E860 Dehydration: Secondary | ICD-10-CM | POA: Diagnosis present

## 2020-12-01 DIAGNOSIS — I10 Essential (primary) hypertension: Secondary | ICD-10-CM | POA: Diagnosis present

## 2020-12-01 DIAGNOSIS — R1013 Epigastric pain: Secondary | ICD-10-CM | POA: Diagnosis present

## 2020-12-01 DIAGNOSIS — R1011 Right upper quadrant pain: Secondary | ICD-10-CM

## 2020-12-01 DIAGNOSIS — D509 Iron deficiency anemia, unspecified: Secondary | ICD-10-CM | POA: Diagnosis present

## 2020-12-01 DIAGNOSIS — E1165 Type 2 diabetes mellitus with hyperglycemia: Secondary | ICD-10-CM | POA: Diagnosis present

## 2020-12-01 DIAGNOSIS — G43909 Migraine, unspecified, not intractable, without status migrainosus: Secondary | ICD-10-CM | POA: Diagnosis present

## 2020-12-01 DIAGNOSIS — Z9049 Acquired absence of other specified parts of digestive tract: Secondary | ICD-10-CM

## 2020-12-01 DIAGNOSIS — K8500 Idiopathic acute pancreatitis without necrosis or infection: Secondary | ICD-10-CM

## 2020-12-01 LAB — CBG MONITORING, ED: Glucose-Capillary: 416 mg/dL — ABNORMAL HIGH (ref 70–99)

## 2020-12-01 LAB — URINALYSIS, COMPLETE (UACMP) WITH MICROSCOPIC
Bilirubin Urine: NEGATIVE
Glucose, UA: >=500 mg/dL — AB
Ketones, ur: NEGATIVE mg/dL
Leukocytes,Ua: NEGATIVE
Nitrite: NEGATIVE
Protein, ur: 100 mg/dL — AB
Specific Gravity, Urine: 1.036 — ABNORMAL HIGH (ref 1.005–1.030)
pH: 6 (ref 5.0–8.0)

## 2020-12-01 LAB — COMPREHENSIVE METABOLIC PANEL
ALT: 12 U/L (ref 0–44)
AST: 14 U/L — ABNORMAL LOW (ref 15–41)
Albumin: 3.5 g/dL (ref 3.5–5.0)
Alkaline Phosphatase: 92 U/L (ref 38–126)
Anion gap: 5 (ref 5–15)
BUN: 12 mg/dL (ref 6–20)
CO2: 24 mmol/L (ref 22–32)
Calcium: 8.7 mg/dL — ABNORMAL LOW (ref 8.9–10.3)
Chloride: 102 mmol/L (ref 98–111)
Creatinine, Ser: 0.8 mg/dL (ref 0.44–1.00)
GFR, Estimated: >60 mL/min (ref >60)
Glucose, Bld: 453 mg/dL — ABNORMAL HIGH (ref 70–99)
Potassium: 4 mmol/L (ref 3.5–5.1)
Sodium: 131 mmol/L — ABNORMAL LOW (ref 135–145)
Total Bilirubin: 0.5 mg/dL (ref 0.3–1.2)
Total Protein: 7.7 g/dL (ref 6.5–8.1)

## 2020-12-01 LAB — CBC
HCT: 33.8 % — ABNORMAL LOW (ref 36.0–46.0)
Hemoglobin: 12.2 g/dL (ref 12.0–15.0)
MCH: 26.8 pg (ref 26.0–34.0)
MCHC: 36.1 g/dL — ABNORMAL HIGH (ref 30.0–36.0)
MCV: 74.1 fL — ABNORMAL LOW (ref 80.0–100.0)
Platelets: 313 10*3/uL (ref 150–400)
RBC: 4.56 MIL/uL (ref 3.87–5.11)
RDW: 12.4 % (ref 11.5–15.5)
WBC: 10.7 10*3/uL — ABNORMAL HIGH (ref 4.0–10.5)
nRBC: 0 % (ref 0.0–0.2)

## 2020-12-01 LAB — POC URINE PREG, ED: Preg Test, Ur: NEGATIVE

## 2020-12-01 LAB — LIPASE, BLOOD: Lipase: 764 U/L — ABNORMAL HIGH (ref 11–51)

## 2020-12-01 MED ORDER — MORPHINE SULFATE (PF) 4 MG/ML IV SOLN
4.0000 mg | Freq: Once | INTRAVENOUS | Status: AC
  Administered 2020-12-01: 23:00:00 4 mg via INTRAVENOUS
  Filled 2020-12-01: qty 1

## 2020-12-01 MED ORDER — IOHEXOL 300 MG/ML  SOLN
100.0000 mL | Freq: Once | INTRAMUSCULAR | Status: AC | PRN
  Administered 2020-12-01: 23:00:00 100 mL via INTRAVENOUS

## 2020-12-01 MED ORDER — LACTATED RINGERS IV BOLUS
1000.0000 mL | Freq: Once | INTRAVENOUS | Status: AC
  Administered 2020-12-01: 23:00:00 1000 mL via INTRAVENOUS

## 2020-12-01 NOTE — ED Triage Notes (Signed)
Pt arrived via POV with reports of abd pain since Saturday, pt states the pain has not changed, but states she cannot wait for her doctor's appt.   Pt reports generalized upper abd pain. Pt reports she has hx of similar pain and was told it was her gallbladder. Pt states she has not been vomiting this time.

## 2020-12-01 NOTE — ED Provider Notes (Signed)
[facility] Emergency Department Provider Note ____________________________________________   Event Date/Time   First MD Initiated Contact with Patient 12/01/20 2148     (approximate)  I have reviewed the triage vital signs and the nursing notes.  HISTORY  Chief Complaint Abdominal Pain   HPI Brandi Oneal is a 50 y.o. femalewho presents to the ED for evaluation of abd pain.   Chart review indicates no relevant history.  Patient presents to the ED for evaluation of 3 days of periumbilical abdominal pain with associated nausea.  She reports radiation of the pain towards her back.  She reports severe 10/10 pain.  She reports the pain is worse at night when she was trying to sleep and laying in bed.  Denies any fevers, stool changes, dysuria, syncopal episodes or substernal chest pain.  Patient reports that she drinks no alcohol, no recreational drugs.  She is s/p cholecystectomy.  This has never happened before.  Past Medical History:  Diagnosis Date   Diabetes mellitus without complication (HCC)     There are no problems to display for this patient.   History reviewed. No pertinent surgical history.  Prior to Admission medications   Medication Sig Start Date End Date Taking? Authorizing Provider  aspirin 81 MG chewable tablet Chew 81 mg by mouth daily.    [provider]  atorvastatin (LIPITOR) 40 MG tablet Take 40 mg by mouth daily.    [provider]  liraglutide (VICTOZA) 18 MG/3ML SOPN Inject into the skin.    [provider]  lisinopril (ZESTRIL) 2.5 MG tablet Take 2.5 mg by mouth daily.    [provider]  naproxen (NAPROSYN) 500 MG tablet Take 1 tablet (500 mg total) by mouth 2 (two) times daily with a meal. 07/10/20   Tommi Rumps, PA-C    Allergies Patient has no known allergies.  No family history on file.  Social History Social History   Tobacco Use   Smoking status: Never    Passive  exposure: Never   Smokeless tobacco: Never  Vaping Use   Vaping Use: Never used  Substance Use Topics   Alcohol use: Not Currently    Review of Systems  Constitutional: No fever/chills Eyes: No visual changes. ENT: No sore throat. Cardiovascular: Denies chest pain. Respiratory: Denies shortness of breath. Gastrointestinal: Positive for abdominal pain and nausea no vomiting.  No diarrhea.  No constipation. Genitourinary: Negative for dysuria. Musculoskeletal: Negative for back pain. Skin: Negative for rash. Neurological: Negative for headaches, focal weakness or numbness.  ____________________________________________   PHYSICAL EXAM:  VITAL SIGNS: Vitals:   12/01/20 2200 12/01/20 2300  BP: (!) 145/73 (!) 157/88  Pulse: 96 89  Resp: 17 16  Temp:    SpO2: 100% 100%     Constitutional: Alert and oriented.  Appears uncomfortable, but in no acute distress. Eyes: Conjunctivae are normal. PERRL. EOMI. Head: Atraumatic. Nose: No congestion/rhinnorhea. Mouth/Throat: Mucous membranes are moist.  Oropharynx non-erythematous. Neck: No stridor. No cervical spine tenderness to palpation. Cardiovascular: Normal rate, regular rhythm. Grossly normal heart sounds.  Good peripheral circulation. Respiratory: Normal respiratory effort.  No retractions. Lungs CTAB. Gastrointestinal: Soft , nondistended. No CVA tenderness. Periumbilical and epigastric tenderness to palpation with voluntary guarding.  Lower abdomen is benign. Musculoskeletal: No lower extremity tenderness nor edema.  No joint effusions. No signs of acute trauma. Neurologic:  Normal speech and language. No gross focal neurologic deficits are appreciated. No gait instability noted. Skin:  Skin is warm, dry  and intact. No rash noted. Psychiatric: Mood and affect are normal. Speech and behavior are normal.  ____________________________________________   LABS (all labs ordered are listed, but only abnormal results are  displayed)  Labs Reviewed  LIPASE, BLOOD - Abnormal; Notable for the following components:      Result Value   Lipase 764 (*)    All other components within normal limits  COMPREHENSIVE METABOLIC PANEL - Abnormal; Notable for the following components:   Sodium 131 (*)    Glucose, Bld 453 (*)    Calcium 8.7 (*)    AST 14 (*)    All other components within normal limits  CBC - Abnormal; Notable for the following components:   WBC 10.7 (*)    HCT 33.8 (*)    MCV 74.1 (*)    MCHC 36.1 (*)    All other components within normal limits  URINALYSIS, COMPLETE (UACMP) WITH MICROSCOPIC - Abnormal; Notable for the following components:   Color, Urine YELLOW (*)    APPearance CLEAR (*)    Specific Gravity, Urine 1.036 (*)    Glucose, UA >=500 (*)    Hgb urine dipstick SMALL (*)    Protein, ur 100 (*)    Bacteria, UA RARE (*)    All other components within normal limits  CBG MONITORING, ED - Abnormal; Notable for the following components:   Glucose-Capillary 416 (*)    All other components within normal limits  SARS CORONAVIRUS 2 (TAT 6-24 HRS)  POC URINE PREG, ED   ____________________________________________  12 Lead EKG  Sinus tachycardia, rate of 101 bpm.  Normal axis and intervals.  No evidence of acute ischemia. ____________________________________________  RADIOLOGY  ED MD interpretation: RUQ ultrasound reviewed by me without evidence of biliary pathology  Official radiology report(s): US Abdomen Limited RUQ (LIVER/GB)  Result Date: 12/01/2020 CLINICAL DATA:  Right upper quadrant pain x3 days. Prior cholecystectomy. EXAM: ULTRASOUND ABDOMEN LIMITED RIGHT UPPER QUADRANT COMPARISON:  None. FINDINGS: Gallbladder: The gallbladder is surgically absent. Common bile duct: Diameter: 7.3 mm Liver: A 2.7 cm x 3.2 cm x 2.8 cm anechoic structure is seen within the right lobe of the liver. This is adjacent to the gallbladder fossa. No abnormal flow is seen within this region on color  Doppler evaluation. Within normal limits in parenchymal echogenicity. Portal vein is patent on color Doppler imaging with normal direction of blood flow towards the liver. Other: None. IMPRESSION: 1. Evidence of prior cholecystectomy. 2. Large simple hepatic cyst. Electronically Signed   By: Aram Candela M.D.   On: 12/01/2020 22:41    ____________________________________________   PROCEDURES and INTERVENTIONS  Procedure(s) performed (including Critical Care):  .1-3 Lead EKG Interpretation  Date/Time: 12/01/2020 10:26 PM Performed by: Delton Prairie, MD Authorized by: Delton Prairie, MD     Interpretation: normal     ECG rate:  94   ECG rate assessment: normal     Rhythm: sinus rhythm     Ectopy: none     Conduction: normal    Medications  lactated ringers bolus 1,000 mL (1,000 mLs Intravenous New Bag/Given 12/01/20 2303)  morphine 4 MG/ML injection 4 mg (4 mg Intravenous Given 12/01/20 2304)  iohexol (OMNIPAQUE) 300 MG/ML solution 100 mL (100 mLs Intravenous Contrast Given 12/01/20 2312)    ____________________________________________   MDM / ED COURSE   50 year old woman presents to the ED with first-time episode of idiopathic pancreatitis requiring admission for pain control.  Normal vitals.  Exam with tenderness without peritoneal features.  Blood  work with elevated lipase to the 700s.  She is status post cholecystectomy and denies ethanol intake, so I suspect idiopathic acute pancreatitis.  We will obtain CT scan to assess for complications such as pseudocyst.  Considering her poorly controlled pain we will plan for medical observation admission.  Patient signed out to oncoming divider pending CT abdomen/pelvis to assess for associated complications from her idiopathic acute pancreatitis.  Anticipate admission thereafter.    ____________________________________________   FINAL CLINICAL IMPRESSION(S) / ED DIAGNOSES  Final diagnoses:  RUQ pain  Idiopathic acute  pancreatitis without infection or necrosis     ED Discharge Orders     None        Merrit Friesen   Note:  This document was prepared using Dragon voice recognition software and may include unintentional dictation errors.    Delton Prairie, MD 12/01/20 [phone]

## 2020-12-02 ENCOUNTER — Encounter: Payer: Self-pay | Admitting: Internal Medicine

## 2020-12-02 DIAGNOSIS — Z20822 Contact with and (suspected) exposure to covid-19: Secondary | ICD-10-CM | POA: Diagnosis present

## 2020-12-02 DIAGNOSIS — K859 Acute pancreatitis without necrosis or infection, unspecified: Secondary | ICD-10-CM | POA: Diagnosis present

## 2020-12-02 DIAGNOSIS — E119 Type 2 diabetes mellitus without complications: Secondary | ICD-10-CM

## 2020-12-02 DIAGNOSIS — K858 Other acute pancreatitis without necrosis or infection: Secondary | ICD-10-CM | POA: Diagnosis not present

## 2020-12-02 DIAGNOSIS — K8500 Idiopathic acute pancreatitis without necrosis or infection: Secondary | ICD-10-CM | POA: Diagnosis not present

## 2020-12-02 DIAGNOSIS — I10 Essential (primary) hypertension: Secondary | ICD-10-CM | POA: Diagnosis present

## 2020-12-02 DIAGNOSIS — R1011 Right upper quadrant pain: Secondary | ICD-10-CM | POA: Diagnosis not present

## 2020-12-02 DIAGNOSIS — D72829 Elevated white blood cell count, unspecified: Secondary | ICD-10-CM

## 2020-12-02 DIAGNOSIS — Z9049 Acquired absence of other specified parts of digestive tract: Secondary | ICD-10-CM | POA: Diagnosis not present

## 2020-12-02 DIAGNOSIS — D509 Iron deficiency anemia, unspecified: Secondary | ICD-10-CM | POA: Diagnosis present

## 2020-12-02 DIAGNOSIS — G43909 Migraine, unspecified, not intractable, without status migrainosus: Secondary | ICD-10-CM | POA: Diagnosis present

## 2020-12-02 DIAGNOSIS — R1013 Epigastric pain: Secondary | ICD-10-CM

## 2020-12-02 DIAGNOSIS — Z794 Long term (current) use of insulin: Secondary | ICD-10-CM | POA: Diagnosis not present

## 2020-12-02 DIAGNOSIS — E860 Dehydration: Secondary | ICD-10-CM

## 2020-12-02 DIAGNOSIS — R000 Tachycardia, unspecified: Secondary | ICD-10-CM | POA: Diagnosis present

## 2020-12-02 DIAGNOSIS — Z79899 Other long term (current) drug therapy: Secondary | ICD-10-CM | POA: Diagnosis not present

## 2020-12-02 DIAGNOSIS — K7689 Other specified diseases of liver: Secondary | ICD-10-CM | POA: Diagnosis present

## 2020-12-02 DIAGNOSIS — E1165 Type 2 diabetes mellitus with hyperglycemia: Secondary | ICD-10-CM | POA: Diagnosis present

## 2020-12-02 DIAGNOSIS — R739 Hyperglycemia, unspecified: Secondary | ICD-10-CM

## 2020-12-02 DIAGNOSIS — Z7982 Long term (current) use of aspirin: Secondary | ICD-10-CM | POA: Diagnosis not present

## 2020-12-02 DIAGNOSIS — E1169 Type 2 diabetes mellitus with other specified complication: Secondary | ICD-10-CM | POA: Diagnosis not present

## 2020-12-02 LAB — URINE DRUG SCREEN, QUALITATIVE (ARMC ONLY)
Amphetamines, Ur Screen: NOT DETECTED
Barbiturates, Ur Screen: NOT DETECTED
Benzodiazepine, Ur Scrn: NOT DETECTED
Cannabinoid 50 Ng, Ur ~~LOC~~: NOT DETECTED
Cocaine Metabolite,Ur ~~LOC~~: NOT DETECTED
MDMA (Ecstasy)Ur Screen: NOT DETECTED
Methadone Scn, Ur: NOT DETECTED
Opiate, Ur Screen: NOT DETECTED
Phencyclidine (PCP) Ur S: NOT DETECTED
Tricyclic, Ur Screen: NOT DETECTED

## 2020-12-02 LAB — COMPREHENSIVE METABOLIC PANEL
ALT: 12 U/L (ref 0–44)
AST: 13 U/L — ABNORMAL LOW (ref 15–41)
Albumin: 3.1 g/dL — ABNORMAL LOW (ref 3.5–5.0)
Alkaline Phosphatase: 83 U/L (ref 38–126)
Anion gap: 3 — ABNORMAL LOW (ref 5–15)
BUN: 11 mg/dL (ref 6–20)
CO2: 29 mmol/L (ref 22–32)
Calcium: 8.5 mg/dL — ABNORMAL LOW (ref 8.9–10.3)
Chloride: 102 mmol/L (ref 98–111)
Creatinine, Ser: 0.72 mg/dL (ref 0.44–1.00)
GFR, Estimated: >60 mL/min (ref >60)
Glucose, Bld: 352 mg/dL — ABNORMAL HIGH (ref 70–99)
Potassium: 3.6 mmol/L (ref 3.5–5.1)
Sodium: 134 mmol/L — ABNORMAL LOW (ref 135–145)
Total Bilirubin: 0.4 mg/dL (ref 0.3–1.2)
Total Protein: 7.3 g/dL (ref 6.5–8.1)

## 2020-12-02 LAB — SARS CORONAVIRUS 2 (TAT 6-24 HRS): SARS Coronavirus 2: NEGATIVE

## 2020-12-02 LAB — MAGNESIUM
Magnesium: 1.7 mg/dL (ref 1.7–2.4)
Magnesium: 1.7 mg/dL (ref 1.7–2.4)

## 2020-12-02 LAB — CBC
HCT: 31.5 % — ABNORMAL LOW (ref 36.0–46.0)
Hemoglobin: 11.2 g/dL — ABNORMAL LOW (ref 12.0–15.0)
MCH: 26.7 pg (ref 26.0–34.0)
MCHC: 35.6 g/dL (ref 30.0–36.0)
MCV: 75.2 fL — ABNORMAL LOW (ref 80.0–100.0)
Platelets: 288 10*3/uL (ref 150–400)
RBC: 4.19 MIL/uL (ref 3.87–5.11)
RDW: 12.7 % (ref 11.5–15.5)
WBC: 11 10*3/uL — ABNORMAL HIGH (ref 4.0–10.5)
nRBC: 0 % (ref 0.0–0.2)

## 2020-12-02 LAB — CBG MONITORING, ED
Glucose-Capillary: 328 mg/dL — ABNORMAL HIGH (ref 70–99)
Glucose-Capillary: 287 mg/dL — ABNORMAL HIGH (ref 70–99)
Glucose-Capillary: 191 mg/dL — ABNORMAL HIGH (ref 70–99)
Glucose-Capillary: 181 mg/dL — ABNORMAL HIGH (ref 70–99)

## 2020-12-02 LAB — GLUCOSE, CAPILLARY: Glucose-Capillary: 173 mg/dL — ABNORMAL HIGH (ref 70–99)

## 2020-12-02 LAB — HIV ANTIBODY (ROUTINE TESTING W REFLEX): HIV Screen 4th Generation wRfx: NONREACTIVE

## 2020-12-02 LAB — PHOSPHORUS: Phosphorus: 2.4 mg/dL — ABNORMAL LOW (ref 2.5–4.6)

## 2020-12-02 LAB — TRIGLYCERIDES: Triglycerides: 158 mg/dL — ABNORMAL HIGH (ref <150)

## 2020-12-02 LAB — ETHANOL: Alcohol, Ethyl (B): <10 mg/dL (ref <10)

## 2020-12-02 MED ORDER — LACTATED RINGERS IV SOLN
INTRAVENOUS | Status: DC
  Administered 2020-12-02: 02:00:00 150 mL via INTRAVENOUS

## 2020-12-02 MED ORDER — ONDANSETRON HCL 4 MG/2ML IJ SOLN
4.0000 mg | Freq: Four times a day (QID) | INTRAMUSCULAR | Status: DC | PRN
  Administered 2020-12-02 (×3): 4 mg via INTRAVENOUS
  Filled 2020-12-02 (×3): qty 2

## 2020-12-02 MED ORDER — ENOXAPARIN SODIUM 40 MG/0.4ML IJ SOSY
40.0000 mg | PREFILLED_SYRINGE | INTRAMUSCULAR | Status: DC
  Administered 2020-12-02 – 2020-12-03 (×2): 40 mg via SUBCUTANEOUS
  Filled 2020-12-02 (×2): qty 0.4

## 2020-12-02 MED ORDER — NALOXONE HCL 2 MG/2ML IJ SOSY
0.4000 mg | PREFILLED_SYRINGE | INTRAMUSCULAR | Status: DC | PRN
  Filled 2020-12-02: qty 2

## 2020-12-02 MED ORDER — MORPHINE SULFATE (PF) 4 MG/ML IV SOLN
4.0000 mg | INTRAVENOUS | Status: DC | PRN
  Administered 2020-12-02 (×3): 4 mg via INTRAVENOUS
  Filled 2020-12-02 (×3): qty 1

## 2020-12-02 MED ORDER — LACTATED RINGERS IV BOLUS
1000.0000 mL | Freq: Once | INTRAVENOUS | Status: AC
  Administered 2020-12-02: 02:00:00 1000 mL via INTRAVENOUS

## 2020-12-02 MED ORDER — ACETAMINOPHEN 650 MG RE SUPP: 650.0000 mg | Freq: Four times a day (QID) | RECTAL | Status: DC | PRN

## 2020-12-02 MED ORDER — MORPHINE SULFATE (PF) 4 MG/ML IV SOLN
4.0000 mg | Freq: Once | INTRAVENOUS | Status: AC
Start: 2020-12-02 — End: 2020-12-02
  Administered 2020-12-02: 02:00:00 4 mg via INTRAVENOUS
  Filled 2020-12-02: qty 1

## 2020-12-02 MED ORDER — INSULIN GLARGINE 100 UNIT/ML ~~LOC~~ SOLN
12.0000 [IU] | Freq: Two times a day (BID) | SUBCUTANEOUS | Status: DC
  Administered 2020-12-02 – 2020-12-04 (×4): 12 [IU] via SUBCUTANEOUS
  Filled 2020-12-02 (×7): qty 0.12

## 2020-12-02 MED ORDER — LACTATED RINGERS IV SOLN: INTRAVENOUS | Status: DC

## 2020-12-02 MED ORDER — SCOPOLAMINE 1 MG/3DAYS TD PT72
1.0000 | MEDICATED_PATCH | TRANSDERMAL | Status: DC
  Administered 2020-12-02: 1.5 mg via TRANSDERMAL
  Filled 2020-12-02: qty 1

## 2020-12-02 MED ORDER — SODIUM CHLORIDE 0.9 % IV SOLN
6.2500 mg | Freq: Four times a day (QID) | INTRAVENOUS | Status: DC | PRN
  Administered 2020-12-02 – 2020-12-03 (×2): 6.25 mg via INTRAVENOUS
  Filled 2020-12-02 (×5): qty 0.25

## 2020-12-02 MED ORDER — INSULIN ASPART 100 UNIT/ML IJ SOLN
0.0000 [IU] | Freq: Four times a day (QID) | INTRAMUSCULAR | Status: DC
  Administered 2020-12-02: 7 [IU] via SUBCUTANEOUS
  Administered 2020-12-02: 2 [IU] via SUBCUTANEOUS
  Administered 2020-12-02: 5 [IU] via SUBCUTANEOUS
  Administered 2020-12-03: 17:00:00 3 [IU] via SUBCUTANEOUS
  Administered 2020-12-03: 12:00:00 2 [IU] via SUBCUTANEOUS
  Administered 2020-12-04: 13:00:00 3 [IU] via SUBCUTANEOUS
  Filled 2020-12-02 (×6): qty 1

## 2020-12-02 MED ORDER — ACETAMINOPHEN 325 MG PO TABS: 650.0000 mg | ORAL_TABLET | Freq: Four times a day (QID) | ORAL | Status: DC | PRN

## 2020-12-02 NOTE — ED Provider Notes (Signed)
  Physical Exam  BP (!) 157/88   Pulse 89   Temp 98.6 F (37 C) (Oral)   Resp 16   Ht 5\' 2"  (1.575 m)   Wt 79.8 kg   LMP 11/10/2020 (Exact Date)   SpO2 100%   BMI 32.19 kg/m   Physical Exam  ED Course/Procedures     Procedures  MDM  11:30 PM  Assumed care of patient at shift change.  Patient here with idiopathic pancreatitis.  CT scan pending for further evaluation.  12:05 AM  Pt's CT scan shows acute pancreatitis without abscess, pseudocyst, necrosis.  She has had previous cholecystectomy.  LFTs normal.  Will discuss with hospitalist for admission.   12:21 AM Discussed patient's case with hospitalist, Dr. 01/10/2021.  I have recommended admission and patient (and family if present) agree with this plan. Admitting physician will place admission orders.   I reviewed all nursing notes, vitals, pertinent previous records and reviewed/interpreted all EKGs, lab and urine results, imaging (as available).       Seletha Zimmermann, Arlean Hopping, DO 12/02/20 0021

## 2020-12-02 NOTE — ED Notes (Signed)
Transport requested

## 2020-12-02 NOTE — H&P (Signed)
History and Physical    PLEASE NOTE THAT DRAGON DICTATION SOFTWARE WAS USED IN THE CONSTRUCTION OF THIS NOTE.   Brandi Oneal JAS:[id number] DOB: 1970/12/09 DOA: 12/01/2020  PCP: Emogene Morgan, MD Patient coming from: home   I have personally briefly reviewed patient's old medical records in [facility]  Chief Complaint: Abdominal pain  HPI: Brandi Oneal is a 50 y.o. female with medical history significant for type 2 diabetes mellitus, hypertension, who is admitted to [facility] on 12/01/2020 with acute pancreatitis after presenting from home to [facility] ED complaining of abdominal pain.   The patient reports 3 days of progressive epigastric pain radiating into the right upper quadrant.  She describes the pain as sharp in nature, nearly constant over the last 3 days, and worsening with palpation of the abdomen.  She notes significant decline in oral intake over that timeframe as well.  She notes associated nausea resulting in 2-3 episodes nonbloody, nonbilious emesis, with most recent episode of vomiting occurring 3 to 4 days ago, and, per the patient, preceding the onset of abdominal pain.  While she notes residual intermittent nausea following the onset of her abdominal pain, she denies any ensuing episodes of vomiting.  Denies any associated recent diarrhea, melena or hematochezia.  She also denies any associated subjective fever, chills, rigors, or generalized myalgias.  No recent trauma or travel.  She notes experiencing a "migraine" approximately 5 to 6 days ago, for which she took a single dose of Aleve.  She notes ensuing resolution of the associated headache.   Denies any prior history of acute pancreatitis.  Denies any regular or recent alcohol consumption.  She also conveys a history of cholecystectomy but denies any recent abdominal surgery or ERCP.  Denies any recent dysuria, gross materia, change in urinary urgency/frequency.  No recent jaundice appearance.   In terms of medications that can be associated with acute pancreatitis, patient denies any recent antibiotic use.  She also denies any recent use of thiazide diuretics, Depakote, or corticosteroids.  However, she conveys that she is on Victoza as a component of her management of type 2 diabetes mellitus, and that this medication was started slightly more than 1 year ago.  Denies any known history of peptic ulcer disease.  Medical history notable for type 2 diabetes mellitus, for which she takes Lantus 45 units subcu twice daily, with most recent dose administered on the morning of 12/01/2020.  Not on any short acting insulin at home.  She is also on Victoza, as noted above.   Denies any recent neck stiffness, rhinitis, rhinorrhea, sore throat, shortness of breath, wheezing,, or rash.  No known recent COVID-19 exposures.  Denies any associated chest pain, diaphoresis, palpitations, dizziness, presyncope, or syncope.     ED Course:  Vital signs in the ED were notable for the following: Tetramex 90.6, initial heart rate 103, which is improved to 89 following interval IV fluids, as further described below; blood pressure 134/69 -148/79; respiratory rate 16-18, oxygen saturation 96 to 100% on room air.  Labs were notable for the following: CMP was notable for the following: Sodium 131, which corrects to approximately 136 when taking into account concomitant hyperglycemia, potassium 4.0, bicarbonate 24, anion gap 5, creatinine 0.80, glucose 453; calcium adjusted for mild hypoalbuminemia 9.1, albumin 3.5, otherwise liver enzymes were found to be within normal limits.  Lipase 764.  Qualitative pregnancy test negative.  CBC notable for white blood cell count of 10,700.  Urinalysis  showed 6-10 white blood cells, rare bacteria, leukocyte Estrace negative, nitrate negative, was associated with specific gravity 1.036.  Nasopharyngeal COVID-19/influenza PCR were checked in the ED this evening, with result currently  pending.  EKG shows sinus tachycardia with heart rate 101, normal intervals, no evidence of T wave or ST changes, including no evidence of ST elevation.  Right upper quadrant ultrasound demonstrated surgically absent gallbladder, and showed no evidence of common bile duct dilation (diameter 7.3 mm ).  Subsequently, CT abdomen/pelvis with contrast showed inflammatory changes of the pancreas consistent with acute pancreatitis without evidence of necrosis, abscess, or pseudocyst.  CT abdomen/pelvis also showed no evidence of common bile duct dilation or choledocholithiasis, and showed no evidence of bowel obstruction, abscess, or perforation.  While in the ED, the following were administered: Morphine 4 mg IV x1, lactated Ringer's x1 L bolus.  Subsequently, the patient was admitted for further evaluation and management of presenting acute pancreatitis.     Review of Systems: As per HPI otherwise 10 point review of systems negative.   Past Medical History:  Diagnosis Date   Diabetes mellitus without complication (HCC)     Past Surgical History:  Procedure Laterality Date   CHOLECYSTECTOMY      Social History:  reports that she has never smoked. She has never been exposed to tobacco smoke. She has never used smokeless tobacco. She reports previous alcohol use. She reports that she does not use drugs.   No Known Allergies  Family history reviewed and not pertinent    Prior to Admission medications   Medication Sig Start Date End Date Taking? Authorizing Provider  aspirin 81 MG chewable tablet Chew 81 mg by mouth daily.    [provider]  atorvastatin (LIPITOR) 40 MG tablet Take 40 mg by mouth daily.    [provider]  liraglutide (VICTOZA) 18 MG/3ML SOPN Inject into the skin.    [provider]  lisinopril (ZESTRIL) 2.5 MG tablet Take 2.5 mg by mouth daily.    [provider]  naproxen (NAPROSYN) 500 MG tablet Take 1 tablet (500 mg total) by mouth 2  (two) times daily with a meal. 07/10/20   Tommi Rumps, PA-C     Objective    Physical Exam: Vitals:   12/01/20 1938 12/01/20 2155 12/01/20 2200 12/01/20 2300  BP: 134/69 (!) 148/79 (!) 145/73 (!) 157/88  Pulse: (!) 103 90 96 89  Resp: Temp: 98.6 F (37 C)     TempSrc: Oral     SpO2: 96% 100% 100% 100%  Weight: 79.8 kg     Height:  (1.575 m)       General: appears to be stated age; alert, oriented Skin: warm, dry, no rash Head:  AT/[place] Mouth:  Oral mucosa membranes appear dry, normal dentition Neck: supple; trachea midline Heart:  RRR; did not appreciate any M/R/G Lungs: CTAB, did not appreciate any wheezes, rales, or rhonchi Abdomen: + BS; soft, ND, mild tenderness to palpation over the epigastrium as well as the bilateral upper abdominal quadrants in the absence of any associated guarding, rigidity, or rebound tenderness Vascular: 2+ pedal pulses b/l; 2+ radial pulses b/l Extremities: no peripheral edema, no muscle wasting Neuro: strength and sensation intact in upper and lower extremities b/l     Labs on Admission: I have personally reviewed following labs and imaging studies  CBC: Recent Labs  Lab 12/01/20 1943  WBC 10.7*  HGB 12.2  HCT 33.8*  MCV 74.1*  PLT 313   Basic Metabolic Panel: Recent Labs  Lab 12/01/20 1943  NA 131*  K 4.0  CL 102  CO2 24  GLUCOSE 453*  BUN 12  CREATININE 0.80  CALCIUM 8.7*   GFR: Estimated Creatinine Clearance: 80.5 mL/min (by C-G formula based on SCr of 0.8 mg/dL). Liver Function Tests: Recent Labs  Lab 12/01/20 1943  AST 14*  ALT 12  ALKPHOS 92  BILITOT 0.5  PROT 7.7  ALBUMIN 3.5   Recent Labs  Lab 12/01/20 1943  LIPASE 764*   No results for input(s): AMMONIA in the last 168 hours. Coagulation Profile: No results for input(s): INR, PROTIME in the last 168 hours. Cardiac Enzymes: No results for input(s): CKTOTAL, CKMB, CKMBINDEX, TROPONINI in the last 168 hours. BNP (last 3  results) No results for input(s): PROBNP in the last 8760 hours. HbA1C: No results for input(s): HGBA1C in the last 72 hours. CBG: Recent Labs  Lab 12/01/20 1942  GLUCAP 416*   Lipid Profile: No results for input(s): CHOL, HDL, LDLCALC, TRIG, CHOLHDL, LDLDIRECT in the last 72 hours. Thyroid Function Tests: No results for input(s): TSH, T4TOTAL, FREET4, T3FREE, THYROIDAB in the last 72 hours. Anemia Panel: No results for input(s): VITAMINB12, FOLATE, FERRITIN, TIBC, IRON, RETICCTPCT in the last 72 hours. Urine analysis:    Component Value Date/Time   COLORURINE YELLOW (A) 12/01/2020 1943   APPEARANCEUR CLEAR (A) 12/01/2020 1943   APPEARANCEUR Turbid 06/24/2011 2215   LABSPEC 1.036 (H) 12/01/2020 1943   LABSPEC 1.021 06/24/2011 2215   PHURINE 6.0 12/01/2020 1943   GLUCOSEU >=500 (A) 12/01/2020 1943   GLUCOSEU >=500 06/24/2011 2215   HGBUR SMALL (A) 12/01/2020 1943   BILIRUBINUR NEGATIVE 12/01/2020 1943   BILIRUBINUR Negative 06/24/2011 2215   KETONESUR NEGATIVE 12/01/2020 1943   PROTEINUR 100 (A) 12/01/2020 1943   NITRITE NEGATIVE 12/01/2020 1943   LEUKOCYTESUR NEGATIVE 12/01/2020 1943   LEUKOCYTESUR 3+ 06/24/2011 2215    Radiological Exams on Admission: CT ABDOMEN PELVIS W CONTRAST  Result Date: 12/01/2020 CLINICAL DATA:  50 year old female with pancreatitis. EXAM: CT ABDOMEN AND PELVIS WITH CONTRAST TECHNIQUE: Multidetector CT imaging of the abdomen and pelvis was performed using the standard protocol following bolus administration of intravenous contrast. CONTRAST:  OMNIPAQUE IOHEXOL 300 MG/ML  SOLN COMPARISON:  CT abdomen pelvis dated 11/09/2009. FINDINGS: Lower chest: The visualized lung bases are clear. No intra-abdominal free air or free fluid. Hepatobiliary: There is a 3.3 cm cyst in the right lobe of the liver. The liver is otherwise unremarkable. No intrahepatic biliary dilatation. Cholecystectomy. No retained calcified stone noted in the central CBD. Pancreas:  There is inflammatory changes of the pancreas consistent with acute pancreatitis. No drainable fluid collection/abscess or pseudocyst. Spleen: Normal in size without focal abnormality. Adrenals/Urinary Tract: The adrenal glands unremarkable. There is no hydronephrosis on either side. There is symmetric enhancement and excretion of contrast by both kidneys. The visualized ureters appear unremarkable. Small pocket of air within the urinary bladder may be related to recent instrumentation. Correlation with urinalysis recommended to exclude cystitis. Stomach/Bowel: There is moderate stool throughout the colon. There is no bowel obstruction or active inflammation. The appendix is not visualized with certainty. No inflammatory changes identified in the right lower quadrant. Vascular/Lymphatic: The abdominal aorta and IVC are unremarkable. No portal venous gas. Several top-normal peripancreatic lymph nodes, reactive. Reproductive: The uterus is anteverted and grossly unremarkable. There is a 15 mm left adnexal corpus luteum. The right ovary is unremarkable. Other: There  is induration of the umbilical fat. Correlation with clinical exam recommended to exclude incarceration. No fluid collection. Musculoskeletal: No acute or significant osseous findings. IMPRESSION: 1. Acute pancreatitis. No abscess or pseudocyst. 2. No bowel obstruction. 3. Small pocket of air within the urinary bladder may be related to recent instrumentation. Correlation with urinalysis recommended to exclude cystitis. Electronically Signed   By: Elgie CollardArash  Radparvar M.D.   On: 12/01/2020 23:32   US Abdomen Limited RUQ (LIVER/GB)  Result Date: 12/01/2020 CLINICAL DATA:  Right upper quadrant pain x3 days. Prior cholecystectomy. EXAM: ULTRASOUND ABDOMEN LIMITED RIGHT UPPER QUADRANT COMPARISON:  None. FINDINGS: Gallbladder: The gallbladder is surgically absent. Common bile duct: Diameter: 7.3 mm Liver: A 2.7 cm x 3.2 cm x 2.8 cm anechoic structure is seen  within the right lobe of the liver. This is adjacent to the gallbladder fossa. No abnormal flow is seen within this region on color Doppler evaluation. Within normal limits in parenchymal echogenicity. Portal vein is patent on color Doppler imaging with normal direction of blood flow towards the liver. Other: None. IMPRESSION: 1. Evidence of prior cholecystectomy. 2. Large simple hepatic cyst. Electronically Signed   By: Aram Candelahaddeus  Houston M.D.   On: 12/01/2020 22:41     EKG: Independently reviewed, with result as described above.    Assessment/Plan   Brandi Oneal is a 50 y.o. female with medical history significant for type 2 diabetes mellitus, hypertension, who is admitted to [facility] on 12/01/2020 with acute pancreatitis after presenting from home [facility] ED complaining of abdominal pain.    Principal Problem:   Acute pancreatitis Active Problems:   Epigastric pain   Hyperglycemia   Leukocytosis   Dehydration   DM2 (diabetes mellitus, type 2) (HCC)   HTN (hypertension)     #) Acute pancreatitis: new diagnosis, in the setting of presenting abdominal pain, nausea/vomiting, elevated lipase, and presenting CT abdomen/pelvis which demonstrated inflammatory changes of the pancreas consistent with acute pancreatitis in the absence of any associated evidence of necrosis, compressing mass, abscess, or pseudocyst.  Acute biliary pancreatitis also appears to be less likely given CT abdomen/pelvis showing surgically absent gallbladder as well as no evidence of common bile duct dilation or overt choledocholithiasis , while presenting labs are not consistent with cholestatic findings.  No evidence of associated infection, and patient appears hemodynamically stable.  Additionally, patient denies any regular or recent alcohol consumption. overall, etiology leading to patient's acute pancreatitis, for which she has previously been diagnosed, is currently unclear.  Differential  includes potential contribution from Victoza, but otherwise no overt pharmacologic contribution at this time.  Will further evaluate for less common causes of acute pancreatitis, including hypercalcemia, hypertriglyceridemia. Will also check IgG4. Overall initial approach to treatment will involve aggressive IVF's and bowel rest with symptomatic management, as further described below.     Plan: NPO. LR @ 150. prn IV morphine. prn IV Zofran. Recheck CMP and serum magnesium levels tomorrow morning, with electrolyte supplementation as needed. Check ionized calcium and triglyceride levels.  Check IgG4.  Add on serum ethanol level.  Check urinary drug screen.  Follow for result of COVID-19 PCR.      #) Type 2 diabetes mellitus: In the context of a documented history of such, on Lantus 45 units subcu twice daily as well as Victoza as an outpatient, she is noted to be hyperglycemic at presentation with presenting blood sugar of 450, but without evidence of DKA given no associated elevated anion gap metabolic acidosis at  this time.  Degree of outpatient glycemic control is not currently clear to me, although will check hemoglobin A1c to further assess this.  Suspect some contribution from dehydration as well as having missed most recent dose of Lantus as she was in the emergency department at the time, in addition to hyperglycemic contribution from physiologic stress as a consequence of presenting acute pancreatitis.   Plan: Lactated Ringer's at 150 cc/h.  Monitor strict I's and O's.  Check hemoglobin A1c. Will resume portion of basal insulin at this time, specifically ordering Lantus 12 units subcu twice daily, with next dose to occur now.  Accu-Cheks every 6 hours with low-dose sliding scale insulin.  Hold home Victoza.  Repeat BMP in the morning.  Add on serum magnesium level.       #) Leukocytosis: CBC reflects mildly elevated presenting white blood cell count, which is suspected to be influenced by  hemoconcentrating effect of relative presenting dehydration given recent decline in oral intake as well as mild auto diuresis as a consequence of osmolar gradient.  By presenting hyperglycemia.  Suspect potential additional inflammatory contribution in the setting of acute pancreatitis.  No evidence of underlying infectious process at this time, including presenting urinalysis that is not consistent with UTI.  Of note, result of COVID-19 test performed in the ED this evening remains pending at this time.  Does not meet criteria for sepsis at this time.   Plan: IV fluids, as above.  Monitor strict I's and O's and weights.  Follow-up result COVID-19 PCR performed in the ED this evening.  Repeat CBC with differential in the morning.  Further evaluation management of presenting acute pancreatitis, as above.      #) Dehydration: Physical exam and clinical evidence to suggest dehydration, with the appearance of dry oral mucous membranes as well as presenting urinalysis showing elevation in specific gravity.  Appears to be on basis of recent decline in oral intake in the setting of acute pancreatitis.  No evidence of associated hypotension or acute kidney injury.  Plan: Continuous IV fluids, as above.  Monitor strict I's and O's and daily weights.  Repeat BMP in the morning.  Add on serum magnesium level.       #) Essential hypertension: On lisinopril as an outpatient.  Systolic blood pressures in the ED noted to be in the 130s to 140s mmHg.   Plan: Hold home lisinopril for now in the setting of current n.p.o. status.  Close monitoring of ensuing blood pressure via routine vital signs.       DVT prophylaxis: SCDs Code Status: Full code Family Communication: none Disposition Plan: Per Rounding Team Consults called: none  Admission status: Inpatient; med-tele.      Of note, this patient was added by me to the following Admit List/Treatment Team: [facility].      PLEASE NOTE THAT  DRAGON DICTATION SOFTWARE WAS USED IN THE CONSTRUCTION OF THIS NOTE.   Angie Fava DO [facility] Pager [phone] From 6PM - 2AM  Otherwise, please contact night-coverage  [email or web address] Password [facility]   12/02/2020, 12:31 AM

## 2020-12-02 NOTE — Progress Notes (Addendum)
PROGRESS NOTE    Brandi Oneal  MGQ:[id number] DOB: 11/17/1970 DOA: 12/01/2020 PCP: Emogene Morgan, MD    Assessment & Plan:   Principal Problem:   Acute pancreatitis Active Problems:   Epigastric pain   Hyperglycemia   Leukocytosis   Dehydration   DM2 (diabetes mellitus, type 2) (HCC)   HTN (hypertension)   Acute pancreatitis: etiology unclear, hx of cholecystectomy & TG only 158. Pt denies alcohol use. NPO. Continue on IVFs. Morphine prn for pain. Zofran & phenergan prn for nausea/vomiting. Started on scopolamine patch   DM2: likely poorly controlled. Continue on lantus, SSI w/ accuchecks   Microcytic anemia: will check iron panel. No need for a transfusion currently   Leukocytosis: likely reactive. Will continue to monitor   Dehydration: continue on IVFs  HTN: continue to hold home dose of lisinopril    DVT prophylaxis: lovenox  Code Status: full  Family Communication:  Disposition Plan: likely d/c back home   Level of care: Med-Surg  Status is: Inpatient  Remains inpatient appropriate because:IV treatments appropriate due to intensity of illness or inability to take PO and Inpatient level of care appropriate due to severity of illness  Dispo: The patient is from: Home              Anticipated d/c is to: Home              Patient currently is not medically stable to d/c.   Difficult to place patient : unclear   Consultants:    Procedures:   Antimicrobials:  Subjective: Pt c/o nausea   Objective: Vitals:   12/02/20 0530 12/02/20 0600 12/02/20 0630 12/02/20 0800  BP: 116/70 110/69 125/69 112/73  Pulse: 75 78 80 75  Resp: 14 16 15  (!) 33  Temp:      TempSrc:      SpO2: 98% 97% 98% 100%  Weight:      Height:       No intake or output data in the 24 hours ending 12/02/20 0850 Filed Weights   12/01/20 1938  Weight: 79.8 kg    Examination:  General exam: Appears calm but uncomfortable  Respiratory system: Clear to auscultation.  Respiratory effort normal. Cardiovascular system: S1 & S2 +. No  rubs, gallops or clicks. No pedal edema. Gastrointestinal system: Abdomen is nondistended, soft and tenderness to palpation. Hypoactive bowel sounds heard. Central nervous system: Alert and oriented. Moves all extremities  Psychiatry: Judgement and insight appear normal. Flat mood and affect     Data Reviewed: I have personally reviewed following labs and imaging studies  CBC: Recent Labs  Lab 12/01/20 1943 12/02/20 0350  WBC 10.7* 11.0*  HGB 12.2 11.2*  HCT 33.8* 31.5*  MCV 74.1* 75.2*  PLT 313 288   Basic Metabolic Panel: Recent Labs  Lab 12/01/20 1943 12/02/20 0350  NA 131* 134*  K 4.0 3.6  CL 102 102  CO2 24 29  GLUCOSE 453* 352*  BUN 12 11  CREATININE 0.80 0.72  CALCIUM 8.7* 8.5*  MG 1.7 1.7  PHOS  --  2.4*   GFR: Estimated Creatinine Clearance: 80.5 mL/min (by C-G formula based on SCr of 0.72 mg/dL). Liver Function Tests: Recent Labs  Lab 12/01/20 1943 12/02/20 0350  AST 14* 13*  ALT 12 12  ALKPHOS 92 83  BILITOT 0.5 0.4  PROT 7.7 7.3  ALBUMIN 3.5 3.1*   Recent Labs  Lab 12/01/20 1943  LIPASE 764*   No results for input(s): AMMONIA in  the last 168 hours. Coagulation Profile: No results for input(s): INR, PROTIME in the last 168 hours. Cardiac Enzymes: No results for input(s): CKTOTAL, CKMB, CKMBINDEX, TROPONINI in the last 168 hours. BNP (last 3 results) No results for input(s): PROBNP in the last 8760 hours. HbA1C: No results for input(s): HGBA1C in the last 72 hours. CBG: Recent Labs  Lab 12/01/20 1942 12/02/20 0157 12/02/20 0513  GLUCAP 416* 328* 287*   Lipid Profile: Recent Labs    12/02/20 0350  TRIG 158*   Thyroid Function Tests: No results for input(s): TSH, T4TOTAL, FREET4, T3FREE, THYROIDAB in the last 72 hours. Anemia Panel: No results for input(s): VITAMINB12, FOLATE, FERRITIN, TIBC, IRON, RETICCTPCT in the last 72 hours. Sepsis Labs: No results for  input(s): PROCALCITON, LATICACIDVEN in the last 168 hours.  No results found for this or any previous visit (from the past 240 hour(s)).       Radiology Studies: CT ABDOMEN PELVIS W CONTRAST  Result Date: 12/01/2020 CLINICAL DATA:  50 year old female with pancreatitis. EXAM: CT ABDOMEN AND PELVIS WITH CONTRAST TECHNIQUE: Multidetector CT imaging of the abdomen and pelvis was performed using the standard protocol following bolus administration of intravenous contrast. CONTRAST:  OMNIPAQUE IOHEXOL 300 MG/ML  SOLN COMPARISON:  CT abdomen pelvis dated 11/09/2009. FINDINGS: Lower chest: The visualized lung bases are clear. No intra-abdominal free air or free fluid. Hepatobiliary: There is a 3.3 cm cyst in the right lobe of the liver. The liver is otherwise unremarkable. No intrahepatic biliary dilatation. Cholecystectomy. No retained calcified stone noted in the central CBD. Pancreas: There is inflammatory changes of the pancreas consistent with acute pancreatitis. No drainable fluid collection/abscess or pseudocyst. Spleen: Normal in size without focal abnormality. Adrenals/Urinary Tract: The adrenal glands unremarkable. There is no hydronephrosis on either side. There is symmetric enhancement and excretion of contrast by both kidneys. The visualized ureters appear unremarkable. Small pocket of air within the urinary bladder may be related to recent instrumentation. Correlation with urinalysis recommended to exclude cystitis. Stomach/Bowel: There is moderate stool throughout the colon. There is no bowel obstruction or active inflammation. The appendix is not visualized with certainty. No inflammatory changes identified in the right lower quadrant. Vascular/Lymphatic: The abdominal aorta and IVC are unremarkable. No portal venous gas. Several top-normal peripancreatic lymph nodes, reactive. Reproductive: The uterus is anteverted and grossly unremarkable. There is a 15 mm left adnexal corpus luteum. The  right ovary is unremarkable. Other: There is induration of the umbilical fat. Correlation with clinical exam recommended to exclude incarceration. No fluid collection. Musculoskeletal: No acute or significant osseous findings. IMPRESSION: 1. Acute pancreatitis. No abscess or pseudocyst. 2. No bowel obstruction. 3. Small pocket of air within the urinary bladder may be related to recent instrumentation. Correlation with urinalysis recommended to exclude cystitis. Electronically Signed   By: Elgie Collard M.D.   On: 12/01/2020 23:32   US Abdomen Limited RUQ (LIVER/GB)  Result Date: 12/01/2020 CLINICAL DATA:  Right upper quadrant pain x3 days. Prior cholecystectomy. EXAM: ULTRASOUND ABDOMEN LIMITED RIGHT UPPER QUADRANT COMPARISON:  None. FINDINGS: Gallbladder: The gallbladder is surgically absent. Common bile duct: Diameter: 7.3 mm Liver: A 2.7 cm x 3.2 cm x 2.8 cm anechoic structure is seen within the right lobe of the liver. This is adjacent to the gallbladder fossa. No abnormal flow is seen within this region on color Doppler evaluation. Within normal limits in parenchymal echogenicity. Portal vein is patent on color Doppler imaging with normal direction of blood flow towards the liver. Other:  None. IMPRESSION: 1. Evidence of prior cholecystectomy. 2. Large simple hepatic cyst. Electronically Signed   By: Aram Candela M.D.   On: 12/01/2020 22:41        Scheduled Meds:  insulin aspart  0-9 Units Subcutaneous Q6H   insulin glargine  12 Units Subcutaneous BID   Continuous Infusions:  lactated ringers 150 mL (12/02/20 0210)     LOS: 0 days    Time spent: 32 mins     Charise Killian, MD [facility] Pager [phone]  If 7PM-7AM, please contact night-coverage  12/02/2020, 8:50 AM

## 2020-12-03 DIAGNOSIS — K858 Other acute pancreatitis without necrosis or infection: Secondary | ICD-10-CM

## 2020-12-03 DIAGNOSIS — E1169 Type 2 diabetes mellitus with other specified complication: Secondary | ICD-10-CM

## 2020-12-03 LAB — LIPASE, BLOOD: Lipase: 138 U/L — ABNORMAL HIGH (ref 11–51)

## 2020-12-03 LAB — CBC
HCT: 33.8 % — ABNORMAL LOW (ref 36.0–46.0)
Hemoglobin: 12.2 g/dL (ref 12.0–15.0)
MCH: 26.5 pg (ref 26.0–34.0)
MCHC: 36.1 g/dL — ABNORMAL HIGH (ref 30.0–36.0)
MCV: 73.3 fL — ABNORMAL LOW (ref 80.0–100.0)
Platelets: 321 10*3/uL (ref 150–400)
RBC: 4.61 MIL/uL (ref 3.87–5.11)
RDW: 12.4 % (ref 11.5–15.5)
WBC: 9.6 10*3/uL (ref 4.0–10.5)
nRBC: 0 % (ref 0.0–0.2)

## 2020-12-03 LAB — IRON AND TIBC
Iron: 77 ug/dL (ref 28–170)
Saturation Ratios: 26 % (ref 10.4–31.8)
TIBC: 297 ug/dL (ref 250–450)
UIBC: 220 ug/dL

## 2020-12-03 LAB — FERRITIN: Ferritin: 74 ng/mL (ref 11–307)

## 2020-12-03 LAB — HEMOGLOBIN A1C
Hgb A1c MFr Bld: 10.5 % — ABNORMAL HIGH (ref 4.8–5.6)
Mean Plasma Glucose: 255 mg/dL

## 2020-12-03 LAB — BASIC METABOLIC PANEL
Anion gap: 7 (ref 5–15)
BUN: 6 mg/dL (ref 6–20)
CO2: 28 mmol/L (ref 22–32)
Calcium: 8.8 mg/dL — ABNORMAL LOW (ref 8.9–10.3)
Chloride: 104 mmol/L (ref 98–111)
Creatinine, Ser: 0.55 mg/dL (ref 0.44–1.00)
GFR, Estimated: >60 mL/min (ref >60)
Glucose, Bld: 130 mg/dL — ABNORMAL HIGH (ref 70–99)
Potassium: 3.6 mmol/L (ref 3.5–5.1)
Sodium: 139 mmol/L (ref 135–145)

## 2020-12-03 LAB — GLUCOSE, CAPILLARY
Glucose-Capillary: 126 mg/dL — ABNORMAL HIGH (ref 70–99)
Glucose-Capillary: 127 mg/dL — ABNORMAL HIGH (ref 70–99)
Glucose-Capillary: 151 mg/dL — ABNORMAL HIGH (ref 70–99)
Glucose-Capillary: 211 mg/dL — ABNORMAL HIGH (ref 70–99)
Glucose-Capillary: 85 mg/dL (ref 70–99)

## 2020-12-03 LAB — CALCIUM, IONIZED: Calcium, Ionized, Serum: 5.1 mg/dL (ref 4.5–5.6)

## 2020-12-03 MED ORDER — ADULT MULTIVITAMIN W/MINERALS CH
1.0000 | ORAL_TABLET | Freq: Every day | ORAL | Status: DC
  Administered 2020-12-03: 1 via ORAL
  Filled 2020-12-03: qty 1

## 2020-12-03 MED ORDER — DOCUSATE SODIUM 100 MG PO CAPS
200.0000 mg | ORAL_CAPSULE | Freq: Two times a day (BID) | ORAL | Status: DC
  Administered 2020-12-03 (×2): 200 mg via ORAL
  Filled 2020-12-03 (×2): qty 2

## 2020-12-03 MED ORDER — PROSOURCE PLUS PO LIQD
30.0000 mL | Freq: Two times a day (BID) | ORAL | Status: DC
  Administered 2020-12-03: 15:00:00 30 mL via ORAL
  Filled 2020-12-03: qty 30

## 2020-12-03 MED ORDER — BISACODYL 5 MG PO TBEC
5.0000 mg | DELAYED_RELEASE_TABLET | Freq: Every day | ORAL | Status: DC
  Administered 2020-12-03: 10:00:00 5 mg via ORAL
  Filled 2020-12-03: qty 1

## 2020-12-03 MED ORDER — BOOST / RESOURCE BREEZE PO LIQD CUSTOM
1.0000 | Freq: Three times a day (TID) | ORAL | Status: DC
  Administered 2020-12-03: 1 via ORAL

## 2020-12-03 MED ORDER — OXYCODONE-ACETAMINOPHEN 5-325 MG PO TABS
1.0000 | ORAL_TABLET | Freq: Four times a day (QID) | ORAL | Status: DC | PRN
  Administered 2020-12-03: 22:00:00 1 via ORAL
  Filled 2020-12-03: qty 1

## 2020-12-03 NOTE — Progress Notes (Signed)
PROGRESS NOTE    Brandi Oneal  ZYS:[id number] DOB: 08/22/1970 DOA: 12/01/2020 PCP: Emogene Morgan, MD    Assessment & Plan:   Principal Problem:   Acute pancreatitis Active Problems:   Epigastric pain   Hyperglycemia   Leukocytosis   Dehydration   DM2 (diabetes mellitus, type 2) (HCC)   HTN (hypertension)   Acute pancreatitis: slightly improved. Etiology unclear, hx of cholecystectomy & TG only 158. Pt denies alcohol use. Will start clear liquid diet today. Continue on IVFs. Continue on scopolamine patch & zofran/phenergan prn for nausea/vomiting. Morphine & percocet prn   DM2: likely poorly controlled. Continue on lantus, SSI w/ accuchecks   Microcytic anemia: iron panel is WNL, etiology unclear. H&H are stable   Leukocytosis: resolved   Dehydration: improved w/ IVFs  HTN: continue to hold home dose ACE-I   DVT prophylaxis: lovenox  Code Status: full  Family Communication:  Disposition Plan: likely d/c back home   Level of care: Med-Surg  Status is: Inpatient  Remains inpatient appropriate because:IV treatments appropriate due to intensity of illness or inability to take PO and Inpatient level of care appropriate due to severity of illness  Dispo: The patient is from: Home              Anticipated d/c is to: Home              Patient currently is not medically stable to d/c.   Difficult to place patient : unclear   Consultants:    Procedures:   Antimicrobials:  Subjective: Pt c/o malaise.   Objective: Vitals:   12/02/20 1300 12/02/20 1809 12/02/20 1929 12/03/20 0556  BP: 138/84 (!) 169/84 (!) 161/74 136/74  Pulse: 73 85 89 79  Resp: 16 17 18 18   Temp:  97.8 F (36.6 C) 98.2 F (36.8 C) 97.9 F (36.6 C)  TempSrc:      SpO2: 97% 100% 100% 100%  Weight:      Height:        Intake/Output Summary (Last 24 hours) at 12/03/2020 0711 Last data filed at 12/02/2020 12/04/2020 Gross per 24 hour  Intake 1000 ml  Output --  Net 1000 ml   Filed Weights    12/01/20 1938  Weight: 79.8 kg    Examination:  General exam: Appears comfortable  Respiratory system: clear breath sounds b/l  Cardiovascular system: S1/S2+. No rubs or clicks  Gastrointestinal system: Abd is soft, ND, mild tenderness to palpation & hypoactive bowel sounds  Central nervous system: Alert and oriented. Moves all extremities  Psychiatry:  Judgement and insight appear normal. Flat mood and affect     Data Reviewed: I have personally reviewed following labs and imaging studies  CBC: Recent Labs  Lab 12/01/20 1943 12/02/20 0350 12/03/20 0428  WBC 10.7* 11.0* 9.6  HGB 12.2 11.2* 12.2  HCT 33.8* 31.5* 33.8*  MCV 74.1* 75.2* 73.3*  PLT 313 288 321   Basic Metabolic Panel: Recent Labs  Lab 12/01/20 1943 12/02/20 0350 12/03/20 0428  NA 131* 134* 139  K 4.0 3.6 3.6  CL 102 102 104  CO2 24 29 28   GLUCOSE 453* 352* 130*  BUN 12 11 6   CREATININE 0.80 0.72 0.55  CALCIUM 8.7* 8.5* 8.8*  MG 1.7 1.7  --   PHOS  --  2.4*  --    GFR: Estimated Creatinine Clearance: 80.5 mL/min (by C-G formula based on SCr of 0.55 mg/dL). Liver Function Tests: Recent Labs  Lab 12/01/20 1943 12/02/20 0350  AST 14* 13*  ALT 12 12  ALKPHOS 92 83  BILITOT 0.5 0.4  PROT 7.7 7.3  ALBUMIN 3.5 3.1*   Recent Labs  Lab 12/01/20 1943 12/03/20 0428  LIPASE 764* 138*   No results for input(s): AMMONIA in the last 168 hours. Coagulation Profile: No results for input(s): INR, PROTIME in the last 168 hours. Cardiac Enzymes: No results for input(s): CKTOTAL, CKMB, CKMBINDEX, TROPONINI in the last 168 hours. BNP (last 3 results) No results for input(s): PROBNP in the last 8760 hours. HbA1C: Recent Labs    12/01/20 1943  HGBA1C 10.5*   CBG: Recent Labs  Lab 12/02/20 0513 12/02/20 0944 12/02/20 1429 12/02/20 2135 12/03/20 0625  GLUCAP 287* 191* 181* 173* 126*   Lipid Profile: Recent Labs    12/02/20 0350  TRIG 158*   Thyroid Function Tests: No results for  input(s): TSH, T4TOTAL, FREET4, T3FREE, THYROIDAB in the last 72 hours. Anemia Panel: Recent Labs    12/03/20 0428  FERRITIN 74  TIBC 297  IRON 77   Sepsis Labs: No results for input(s): PROCALCITON, LATICACIDVEN in the last 168 hours.  Recent Results (from the past 240 hour(s))  SARS CORONAVIRUS 2 (TAT 6-24 HRS) Nasopharyngeal Nasopharyngeal Swab     Status: None   Collection Time: 12/01/20 11:05 PM   Specimen: Nasopharyngeal Swab  Result Value Ref Range Status   SARS Coronavirus 2 NEGATIVE NEGATIVE Final    Comment: (NOTE) SARS-CoV-2 target nucleic acids are NOT DETECTED.  The SARS-CoV-2 RNA is generally detectable in upper and lower respiratory specimens during the acute phase of infection. Negative results do not preclude SARS-CoV-2 infection, do not rule out co-infections with other pathogens, and should not be used as the sole basis for treatment or other patient management decisions. Negative results must be combined with clinical observations, patient history, and epidemiological information. The expected result is Negative.  Fact Sheet for Patients: HairSlick.no  Fact Sheet for Healthcare Providers: quierodirigir.com  This test is not yet approved or cleared by the [place] FDA and  has been authorized for detection and/or diagnosis of SARS-CoV-2 by FDA under an Emergency Use Authorization (EUA). This EUA will remain  in effect (meaning this test can be used) for the duration of the COVID-19 declaration under Se ction 564(b)(1) of the Act, 21 U.S.C. section 360bbb-3(b)(1), unless the authorization is terminated or revoked sooner.  Performed at [facility], [address], [place], [place] [address]          Radiology Studies: CT ABDOMEN PELVIS W CONTRAST  Result Date: 12/01/2020 CLINICAL DATA:  50 year old female with pancreatitis. EXAM: CT ABDOMEN AND PELVIS WITH CONTRAST TECHNIQUE:  Multidetector CT imaging of the abdomen and pelvis was performed using the standard protocol following bolus administration of intravenous contrast. CONTRAST:  OMNIPAQUE IOHEXOL 300 MG/ML  SOLN COMPARISON:  CT abdomen pelvis dated 11/09/2009. FINDINGS: Lower chest: The visualized lung bases are clear. No intra-abdominal free air or free fluid. Hepatobiliary: There is a 3.3 cm cyst in the right lobe of the liver. The liver is otherwise unremarkable. No intrahepatic biliary dilatation. Cholecystectomy. No retained calcified stone noted in the central CBD. Pancreas: There is inflammatory changes of the pancreas consistent with acute pancreatitis. No drainable fluid collection/abscess or pseudocyst. Spleen: Normal in size without focal abnormality. Adrenals/Urinary Tract: The adrenal glands unremarkable. There is no hydronephrosis on either side. There is symmetric enhancement and excretion of contrast by both kidneys. The visualized ureters appear unremarkable. Small pocket of  air within the urinary bladder may be related to recent instrumentation. Correlation with urinalysis recommended to exclude cystitis. Stomach/Bowel: There is moderate stool throughout the colon. There is no bowel obstruction or active inflammation. The appendix is not visualized with certainty. No inflammatory changes identified in the right lower quadrant. Vascular/Lymphatic: The abdominal aorta and IVC are unremarkable. No portal venous gas. Several top-normal peripancreatic lymph nodes, reactive. Reproductive: The uterus is anteverted and grossly unremarkable. There is a 15 mm left adnexal corpus luteum. The right ovary is unremarkable. Other: There is induration of the umbilical fat. Correlation with clinical exam recommended to exclude incarceration. No fluid collection. Musculoskeletal: No acute or significant osseous findings. IMPRESSION: 1. Acute pancreatitis. No abscess or pseudocyst. 2. No bowel obstruction. 3. Small pocket of air  within the urinary bladder may be related to recent instrumentation. Correlation with urinalysis recommended to exclude cystitis. Electronically Signed   By: Elgie Collard M.D.   On: 12/01/2020 23:32   US Abdomen Limited RUQ (LIVER/GB)  Result Date: 12/01/2020 CLINICAL DATA:  Right upper quadrant pain x3 days. Prior cholecystectomy. EXAM: ULTRASOUND ABDOMEN LIMITED RIGHT UPPER QUADRANT COMPARISON:  None. FINDINGS: Gallbladder: The gallbladder is surgically absent. Common bile duct: Diameter: 7.3 mm Liver: A 2.7 cm x 3.2 cm x 2.8 cm anechoic structure is seen within the right lobe of the liver. This is adjacent to the gallbladder fossa. No abnormal flow is seen within this region on color Doppler evaluation. Within normal limits in parenchymal echogenicity. Portal vein is patent on color Doppler imaging with normal direction of blood flow towards the liver. Other: None. IMPRESSION: 1. Evidence of prior cholecystectomy. 2. Large simple hepatic cyst. Electronically Signed   By: Aram Candela M.D.   On: 12/01/2020 22:41        Scheduled Meds:  enoxaparin (LOVENOX) injection  40 mg Subcutaneous Q24H   insulin aspart  0-9 Units Subcutaneous Q6H   insulin glargine  12 Units Subcutaneous BID   scopolamine  1 patch Transdermal Q72H   Continuous Infusions:  lactated ringers 100 mL/hr at 12/03/20 0458   promethazine (PHENERGAN) injection (IM or IVPB) Stopped (12/02/20 2216)     LOS: 1 day    Time spent: 30 mins     Charise Killian, MD [facility] Pager [phone]  If 7PM-7AM, please contact night-coverage  12/03/2020, 7:11 AM

## 2020-12-03 NOTE — Progress Notes (Signed)
Initial Nutrition Assessment  DOCUMENTATION CODES:  Obesity unspecified  INTERVENTION:  Continue to advance diet as tolerated.  Add Boost Breeze po TID, each supplement provides 250 kcal and 9 grams of protein.  Add 30 ml ProSource Plus po BID, each supplement provides 100 kcal and 15 grams of protein.    Add MVI with minerals daily.  NUTRITION DIAGNOSIS:  Inadequate oral intake related to decreased appetite, early satiety as evidenced by per patient/family report.  GOAL:  Patient will meet greater than or equal to 90% of their needs  MONITOR:  Diet advancement, PO intake, Supplement acceptance, Labs, Weight trends, I & O's  REASON FOR ASSESSMENT:  Malnutrition Screening Tool    ASSESSMENT:  50 yo female with a PMH of T2DM and HTN who presents with acute pancreatitis. 6/29 - emesis x1  Spoke with pt at bedside. Pt had just received her clear liquids lunch tray. Pt reports that she had been eating well up until Saturday morning when she took just takes bites of meals and experienced early satiety. She did not experience any vomiting until after being admitted to the hospital. Her appetite is also beginning to return a bit.  Pt denies any recent changes in her weight. Per Epic, pt has lost ~3.5 lbs (2%) in the past 5 months, which is not necessarily significant for the time frame.  On exam, pt had one mild depletion, but other than that, no depletions were significant.  Given the above information, pt is not malnourished at this time, but pt is at risk for malnutrition given poor PO intake for almost 1 week now.  Recommend adding Boost Breeze TID, ProSource BID, and MVI with minerals to supplement calories and protein, as well as replete any losses.  Medications: reviewed; colace BID, dulcolax, SSI, Lantus BID, LR @ 100 ml/hr  Labs: reviewed; CBG 126-191 HbA1c: 10.5% (11/2020)  NUTRITION - FOCUSED PHYSICAL EXAM: Flowsheet Row Most Recent Value  Orbital Region No depletion   Upper Arm Region No depletion  Thoracic and Lumbar Region No depletion  Buccal Region No depletion  Temple Region No depletion  Clavicle Bone Region No depletion  Clavicle and Acromion Bone Region No depletion  Scapular Bone Region No depletion  Dorsal Hand Mild depletion  Patellar Region No depletion  Anterior Thigh Region No depletion  Posterior Calf Region No depletion  Edema (RD Assessment) None  Hair Reviewed  Eyes Reviewed  Mouth Reviewed  Skin Reviewed  Nails Reviewed   Diet Order:   Diet Order             Diet clear liquid Room service appropriate? Yes; Fluid consistency: Thin  Diet effective now                  EDUCATION NEEDS:  Education needs have been addressed  Skin:  Skin Assessment: Reviewed RN Assessment  Last BM:  11/25/20 - OBR in place  Height:  Ht Readings from Last 1 Encounters:  12/01/20 5\' 2"  (1.575 m)   Weight:  Wt Readings from Last 1 Encounters:  12/01/20 79.8 kg   Ideal Body Weight:  50 kg  BMI:  Body mass index is 32.19 kg/m.  Estimated Nutritional Needs:  Kcal:  1800-2000 Protein:  95-110 grams Fluid:  >1.8 L  12/03/20, RD, LDN (she/her/hers) Registered Dietitian I After-Hours/Weekend Pager # in [place]

## 2020-12-04 LAB — GLUCOSE, CAPILLARY
Glucose-Capillary: 132 mg/dL — ABNORMAL HIGH (ref 70–99)
Glucose-Capillary: 250 mg/dL — ABNORMAL HIGH (ref 70–99)

## 2020-12-04 LAB — BASIC METABOLIC PANEL
Anion gap: 4 — ABNORMAL LOW (ref 5–15)
BUN: <5 mg/dL — ABNORMAL LOW (ref 6–20)
CO2: 27 mmol/L (ref 22–32)
Calcium: 8.7 mg/dL — ABNORMAL LOW (ref 8.9–10.3)
Chloride: 106 mmol/L (ref 98–111)
Creatinine, Ser: 0.62 mg/dL (ref 0.44–1.00)
GFR, Estimated: >60 mL/min (ref >60)
Glucose, Bld: 143 mg/dL — ABNORMAL HIGH (ref 70–99)
Potassium: 4 mmol/L (ref 3.5–5.1)
Sodium: 137 mmol/L (ref 135–145)

## 2020-12-04 LAB — CBC
HCT: 32.6 % — ABNORMAL LOW (ref 36.0–46.0)
Hemoglobin: 11.8 g/dL — ABNORMAL LOW (ref 12.0–15.0)
MCH: 26.5 pg (ref 26.0–34.0)
MCHC: 36.2 g/dL — ABNORMAL HIGH (ref 30.0–36.0)
MCV: 73.3 fL — ABNORMAL LOW (ref 80.0–100.0)
Platelets: 299 10*3/uL (ref 150–400)
RBC: 4.45 MIL/uL (ref 3.87–5.11)
RDW: 12.5 % (ref 11.5–15.5)
WBC: 7 10*3/uL (ref 4.0–10.5)
nRBC: 0 % (ref 0.0–0.2)

## 2020-12-04 LAB — LIPASE, BLOOD: Lipase: 94 U/L — ABNORMAL HIGH (ref 11–51)

## 2020-12-04 NOTE — Discharge Summary (Signed)
D/c summary was miss labeled as progress note on 12/04/20. Please see that note for full d/c summary

## 2020-12-04 NOTE — Care Management Important Message (Signed)
Important Message  Patient Details  Name: Brandi Oneal MRN: [id number] Date of Birth: 1970-07-09   Medicare Important Message Given:  Yes     Johnell Comings 12/04/2020, 11:30 AM

## 2020-12-04 NOTE — Discharge Summary (Signed)
Physician Discharge Summary  Brandi Oneal Sensabaugh ZOX:[id number] DOB: 1971/04/01 DOA: 12/01/2020  PCP: Emogene MorganAycock, Ngwe A, MD  Admit date: 12/01/2020 Discharge date: 12/04/2020  Admitted From: home Disposition:  home  Recommendations for Outpatient Follow-up:  Follow up with PCP in 1-2 weeks   Home Health:no  Equipment/Devices:  Discharge Condition: stable  CODE STATUS: full  Diet recommendation: Carb Modified  Brief/Interim Summary: HPI was taken from Dr. Arlean HoppingHowerter: Brandi Oneal Kartes is a 50 y.o. female with medical history significant for type 2 diabetes mellitus, hypertension, who is admitted to [facility] on 12/01/2020 with acute pancreatitis after presenting from home to [facility] ED complaining of abdominal pain.    The patient reports 3 days of progressive epigastric pain radiating into the right upper quadrant.  She describes the pain as sharp in nature, nearly constant over the last 3 days, and worsening with palpation of the abdomen.  She notes significant decline in oral intake over that timeframe as well.  She notes associated nausea resulting in 2-3 episodes nonbloody, nonbilious emesis, with most recent episode of vomiting occurring 3 to 4 days ago, and, per the patient, preceding the onset of abdominal pain.  While she notes residual intermittent nausea following the onset of her abdominal pain, she denies any ensuing episodes of vomiting.  Denies any associated recent diarrhea, melena or hematochezia.  She also denies any associated subjective fever, chills, rigors, or generalized myalgias.  No recent trauma or travel.  She notes experiencing a "migraine" approximately 5 to 6 days ago, for which she took a single dose of Aleve.  She notes ensuing resolution of the associated headache.    Denies any prior history of acute pancreatitis.  Denies any regular or recent alcohol consumption.  She also conveys a history of cholecystectomy but denies any recent abdominal surgery or  ERCP.  Denies any recent dysuria, gross materia, change in urinary urgency/frequency.  No recent jaundice appearance.  In terms of medications that can be associated with acute pancreatitis, patient denies any recent antibiotic use.  She also denies any recent use of thiazide diuretics, Depakote, or corticosteroids.  However, she conveys that she is on Victoza as a component of her management of type 2 diabetes mellitus, and that this medication was started slightly more than 1 year ago.  Denies any known history of peptic ulcer disease.   Medical history notable for type 2 diabetes mellitus, for which she takes Lantus 45 units subcu twice daily, with most recent dose administered on the morning of 12/01/2020.  Not on any short acting insulin at home.  She is also on Victoza, as noted above.    Denies any recent neck stiffness, rhinitis, rhinorrhea, sore throat, shortness of breath, wheezing,, or rash.  No known recent COVID-19 exposures.  Denies any associated chest pain, diaphoresis, palpitations, dizziness, presyncope, or syncope.  Hospital course from Dr. Mayford KnifeWilliams 6/29-12/04/20: Pt was found to have acute pancreatitis of unknown etiology. Pt denies alcohol use, has hx of cholecystectomy & TG 158. Pt was treated conservatively w/ bowel rest, IVFs, zofran/phenergan prn & pain meds prn. Pt was able to tolerate a po diet prior to Oneal/c.   Discharge Diagnoses:  Principal Problem:   Acute pancreatitis Active Problems:   Epigastric pain   Hyperglycemia   Leukocytosis   Dehydration   DM2 (diabetes mellitus, type 2) (HCC)   HTN (hypertension)  Acute pancreatitis: slightly improved. Etiology unclear, hx of cholecystectomy & TG only 158. Pt denies alcohol use. Will start  clear liquid diet today. Continue on IVFs. Continue on scopolamine patch & zofran/phenergan prn for nausea/vomiting. Morphine & percocet prn   DM2: likely poorly controlled. Continue on lantus, SSI w/ accuchecks   Microcytic anemia: iron  panel is WNL, etiology unclear. H&H are stable   Leukocytosis: resolved   Dehydration: resolved  HTN: will restart home dose of ACE-I at Oneal/c   Discharge Instructions  Discharge Instructions     Diet Carb Modified   Complete by: As directed    Discharge instructions   Complete by: As directed    F/u w/ PCP in 1-2 weeks   Increase activity slowly   Complete by: As directed       Allergies as of 12/04/2020   No Known Allergies      Medication List     TAKE these medications    Lantus SoloStar 100 UNIT/ML Solostar Pen Generic drug: insulin glargine Inject 45 Units into the skin in the morning and at bedtime.   liraglutide 18 MG/3ML Sopn Commonly known as: VICTOZA Inject 18 mg into the skin every morning.        No Known Allergies  Consultations:    Procedures/Studies: CT ABDOMEN PELVIS W CONTRAST  Result Date: 12/01/2020 CLINICAL DATA:  50 year old female with pancreatitis. EXAM: CT ABDOMEN AND PELVIS WITH CONTRAST TECHNIQUE: Multidetector CT imaging of the abdomen and pelvis was performed using the standard protocol following bolus administration of intravenous contrast. CONTRAST:  OMNIPAQUE IOHEXOL 300 MG/ML  SOLN COMPARISON:  CT abdomen pelvis dated 11/09/2009. FINDINGS: Lower chest: The visualized lung bases are clear. No intra-abdominal free air or free fluid. Hepatobiliary: There is a 3.3 cm cyst in the right lobe of the liver. The liver is otherwise unremarkable. No intrahepatic biliary dilatation. Cholecystectomy. No retained calcified stone noted in the central CBD. Pancreas: There is inflammatory changes of the pancreas consistent with acute pancreatitis. No drainable fluid collection/abscess or pseudocyst. Spleen: Normal in size without focal abnormality. Adrenals/Urinary Tract: The adrenal glands unremarkable. There is no hydronephrosis on either side. There is symmetric enhancement and excretion of contrast by both kidneys. The visualized ureters  appear unremarkable. Small pocket of air within the urinary bladder may be related to recent instrumentation. Correlation with urinalysis recommended to exclude cystitis. Stomach/Bowel: There is moderate stool throughout the colon. There is no bowel obstruction or active inflammation. The appendix is not visualized with certainty. No inflammatory changes identified in the right lower quadrant. Vascular/Lymphatic: The abdominal aorta and IVC are unremarkable. No portal venous gas. Several top-normal peripancreatic lymph nodes, reactive. Reproductive: The uterus is anteverted and grossly unremarkable. There is a 15 mm left adnexal corpus luteum. The right ovary is unremarkable. Other: There is induration of the umbilical fat. Correlation with clinical exam recommended to exclude incarceration. No fluid collection. Musculoskeletal: No acute or significant osseous findings. IMPRESSION: 1. Acute pancreatitis. No abscess or pseudocyst. 2. No bowel obstruction. 3. Small pocket of air within the urinary bladder may be related to recent instrumentation. Correlation with urinalysis recommended to exclude cystitis. Electronically Signed   By: Elgie Collard M.Oneal.   On: 12/01/2020 23:32   US Abdomen Limited RUQ (LIVER/GB)  Result Date: 12/01/2020 CLINICAL DATA:  Right upper quadrant pain x3 days. Prior cholecystectomy. EXAM: ULTRASOUND ABDOMEN LIMITED RIGHT UPPER QUADRANT COMPARISON:  None. FINDINGS: Gallbladder: The gallbladder is surgically absent. Common bile duct: Diameter: 7.3 mm Liver: A 2.7 cm x 3.2 cm x 2.8 cm anechoic structure is seen within the right lobe of the liver.  This is adjacent to the gallbladder fossa. No abnormal flow is seen within this region on color Doppler evaluation. Within normal limits in parenchymal echogenicity. Portal vein is patent on color Doppler imaging with normal direction of blood flow towards the liver. Other: None. IMPRESSION: 1. Evidence of prior cholecystectomy. 2. Large simple  hepatic cyst. Electronically Signed   By: Aram Candela M.Oneal.   On: 12/01/2020 22:41      Subjective: Pt c/o fatigue    Discharge Exam: Vitals:   12/04/20 1018 12/04/20 1109  BP: 139/74 110/71  Pulse: 84 85  Resp: 16 16  Temp: 98 F (36.7 C) 97.8 F (36.6 C)  SpO2: 100% 100%   Vitals:   12/04/20 0500 12/04/20 0612 12/04/20 1018 12/04/20 1109  BP:  135/80 139/74 110/71  Pulse:  77 84 85  Resp:  16 16 16   Temp:  98 F (36.7 C) 98 F (36.7 C) 97.8 F (36.6 C)  TempSrc:  Oral Oral   SpO2:  100% 100% 100%  Weight: 74.4 kg     Height:        General: Pt is alert, awake, not in acute distress Cardiovascular: S1/S2 +, no rubs, no gallops Respiratory: CTA bilaterally, no wheezing, no rhonchi Abdominal: Soft, NT, obese, bowel sounds + Extremities:  no cyanosis    The results of significant diagnostics from this hospitalization (including imaging, microbiology, ancillary and laboratory) are listed below for reference.     Microbiology: Recent Results (from the past 240 hour(s))  SARS CORONAVIRUS 2 (TAT 6-24 HRS) Nasopharyngeal Nasopharyngeal Swab     Status: None   Collection Time: 12/01/20 11:05 PM   Specimen: Nasopharyngeal Swab  Result Value Ref Range Status   SARS Coronavirus 2 NEGATIVE NEGATIVE Final    Comment: (NOTE) SARS-CoV-2 target nucleic acids are NOT DETECTED.  The SARS-CoV-2 RNA is generally detectable in upper and lower respiratory specimens during the acute phase of infection. Negative results do not preclude SARS-CoV-2 infection, do not rule out co-infections with other pathogens, and should not be used as the sole basis for treatment or other patient management decisions. Negative results must be combined with clinical observations, patient history, and epidemiological information. The expected result is Negative.  Fact Sheet for Patients: 12/03/20  Fact Sheet for Healthcare  Providers: HairSlick.no  This test is not yet approved or cleared by the [place] FDA and  has been authorized for detection and/or diagnosis of SARS-CoV-2 by FDA under an Emergency Use Authorization (EUA). This EUA will remain  in effect (meaning this test can be used) for the duration of the COVID-19 declaration under Se ction 564(b)(1) of the Act, 21 U.S.C. section 360bbb-3(b)(1), unless the authorization is terminated or revoked sooner.  Performed at [facility], [address] [place], [place]      Labs: BNP (last 3 results) No results for input(s): BNP in the last 8760 hours. Basic Metabolic Panel: Recent Labs  Lab 12/01/20 1943 12/02/20 0350 12/03/20 0428 12/04/20 0519  NA 131* 134* 139 137  K 4.0 3.6 3.6 4.0  CL 102 102 104 106  CO2 24 29 28 27   GLUCOSE 453* 352* 130* 143*  BUN 12 11 6  <5*  CREATININE 0.80 0.72 0.55 0.62  CALCIUM 8.7* 8.5* 8.8* 8.7*  MG 1.7 1.7  --   --   PHOS  --  2.4*  --   --    Liver Function Tests: Recent Labs  Lab 12/01/20 1943 12/02/20 0350  AST 14* 13*  ALT 12 12  ALKPHOS 92 83  BILITOT 0.5 0.4  PROT 7.7 7.3  ALBUMIN 3.5 3.1*   Recent Labs  Lab 12/01/20 1943 12/03/20 0428 12/04/20 0519  LIPASE 764* 138* 94*   No results for input(s): AMMONIA in the last 168 hours. CBC: Recent Labs  Lab 12/01/20 1943 12/02/20 0350 12/03/20 0428 12/04/20 0519  WBC 10.7* 11.0* 9.6 7.0  HGB 12.2 11.2* 12.2 11.8*  HCT 33.8* 31.5* 33.8* 32.6*  MCV 74.1* 75.2* 73.3* 73.3*  PLT 313 288 321 299   Cardiac Enzymes: No results for input(s): CKTOTAL, CKMB, CKMBINDEX, TROPONINI in the last 168 hours. BNP: Invalid input(s): POCBNP CBG: Recent Labs  Lab 12/03/20 0752 12/03/20 1145 12/03/20 1701 12/03/20 2057 12/04/20 0614  GLUCAP 127* 151* 211* 85 132*   Oneal-Dimer No results for input(s): DDIMER in the last 72 hours. Hgb A1c Recent Labs    12/01/20 1943  HGBA1C 10.5*   Lipid  Profile Recent Labs    12/02/20 0350  TRIG 158*   Thyroid function studies No results for input(s): TSH, T4TOTAL, T3FREE, THYROIDAB in the last 72 hours.  Invalid input(s): FREET3 Anemia work up Recent Labs    12/03/20 0428  FERRITIN 74  TIBC 297  IRON 77   Urinalysis    Component Value Date/Time   COLORURINE YELLOW (A) 12/01/2020 1943   APPEARANCEUR CLEAR (A) 12/01/2020 1943   APPEARANCEUR Turbid 06/24/2011 2215   LABSPEC 1.036 (H) 12/01/2020 1943   LABSPEC 1.021 06/24/2011 2215   PHURINE 6.0 12/01/2020 1943   GLUCOSEU >=500 (A) 12/01/2020 1943   GLUCOSEU >=500 06/24/2011 2215   HGBUR SMALL (A) 12/01/2020 1943   BILIRUBINUR NEGATIVE 12/01/2020 1943   BILIRUBINUR Negative 06/24/2011 2215   KETONESUR NEGATIVE 12/01/2020 1943   PROTEINUR 100 (A) 12/01/2020 1943   NITRITE NEGATIVE 12/01/2020 1943   LEUKOCYTESUR NEGATIVE 12/01/2020 1943   LEUKOCYTESUR 3+ 06/24/2011 2215   Sepsis Labs Invalid input(s): PROCALCITONIN,  WBC,  LACTICIDVEN Microbiology Recent Results (from the past 240 hour(s))  SARS CORONAVIRUS 2 (TAT 6-24 HRS) Nasopharyngeal Nasopharyngeal Swab     Status: None   Collection Time: 12/01/20 11:05 PM   Specimen: Nasopharyngeal Swab  Result Value Ref Range Status   SARS Coronavirus 2 NEGATIVE NEGATIVE Final    Comment: (NOTE) SARS-CoV-2 target nucleic acids are NOT DETECTED.  The SARS-CoV-2 RNA is generally detectable in upper and lower respiratory specimens during the acute phase of infection. Negative results do not preclude SARS-CoV-2 infection, do not rule out co-infections with other pathogens, and should not be used as the sole basis for treatment or other patient management decisions. Negative results must be combined with clinical observations, patient history, and epidemiological information. The expected result is Negative.  Fact Sheet for Patients: HairSlick.no  Fact Sheet for Healthcare  Providers: quierodirigir.com  This test is not yet approved or cleared by the [place] FDA and  has been authorized for detection and/or diagnosis of SARS-CoV-2 by FDA under an Emergency Use Authorization (EUA). This EUA will remain  in effect (meaning this test can be used) for the duration of the COVID-19 declaration under Se ction 564(b)(1) of the Act, 21 U.S.C. section 360bbb-3(b)(1), unless the authorization is terminated or revoked sooner.  Performed at [facility], [address], [place], [place] [address]      Time coordinating discharge: Over 30 minutes  SIGNED:   Charise Killian, MD  [facility] 12/04/2020, 11:47 AM Pager   If  7PM-7AM, please contact night-coverage

## 2020-12-05 LAB — IGG 4: IgG, Subclass 4: 21 mg/dL (ref 2–96)

## 2021-12-31 ENCOUNTER — Emergency Department: Payer: Medicare Other

## 2021-12-31 ENCOUNTER — Emergency Department
Admission: EM | Admit: 2021-12-31 | Discharge: 2021-12-31 | Disposition: A | Discharge status: Home / Self Care | Payer: Medicare Other | Attending: Emergency Medicine | Admitting: Emergency Medicine

## 2021-12-31 ENCOUNTER — Other Ambulatory Visit: Payer: Self-pay

## 2021-12-31 DIAGNOSIS — N390 Urinary tract infection, site not specified: Secondary | ICD-10-CM | POA: Insufficient documentation

## 2021-12-31 DIAGNOSIS — R1013 Epigastric pain: Secondary | ICD-10-CM | POA: Diagnosis present

## 2021-12-31 DIAGNOSIS — K859 Acute pancreatitis without necrosis or infection, unspecified: Secondary | ICD-10-CM | POA: Insufficient documentation

## 2021-12-31 LAB — CBC
HCT: 35.7 % — ABNORMAL LOW (ref 36.0–46.0)
Hemoglobin: 12.2 g/dL (ref 12.0–15.0)
MCH: 25.6 pg — ABNORMAL LOW (ref 26.0–34.0)
MCHC: 34.2 g/dL (ref 30.0–36.0)
MCV: 74.8 fL — ABNORMAL LOW (ref 80.0–100.0)
Platelets: 320 10*3/uL (ref 150–400)
RBC: 4.77 MIL/uL (ref 3.87–5.11)
RDW: 12.8 % (ref 11.5–15.5)
WBC: 10.8 10*3/uL — ABNORMAL HIGH (ref 4.0–10.5)
nRBC: 0 % (ref 0.0–0.2)

## 2021-12-31 LAB — COMPREHENSIVE METABOLIC PANEL
ALT: 17 U/L (ref 0–44)
AST: 20 U/L (ref 15–41)
Albumin: 3.3 g/dL — ABNORMAL LOW (ref 3.5–5.0)
Alkaline Phosphatase: 89 U/L (ref 38–126)
Anion gap: 7 (ref 5–15)
BUN: 11 mg/dL (ref 6–20)
CO2: 23 mmol/L (ref 22–32)
Calcium: 8.7 mg/dL — ABNORMAL LOW (ref 8.9–10.3)
Chloride: 107 mmol/L (ref 98–111)
Creatinine, Ser: 0.64 mg/dL (ref 0.44–1.00)
GFR, Estimated: >60 mL/min (ref >60)
Glucose, Bld: 158 mg/dL — ABNORMAL HIGH (ref 70–99)
Potassium: 3.5 mmol/L (ref 3.5–5.1)
Sodium: 137 mmol/L (ref 135–145)
Total Bilirubin: 0.4 mg/dL (ref 0.3–1.2)
Total Protein: 7.7 g/dL (ref 6.5–8.1)

## 2021-12-31 LAB — URINALYSIS, ROUTINE W REFLEX MICROSCOPIC
Bilirubin Urine: NEGATIVE
Glucose, UA: NEGATIVE mg/dL
Ketones, ur: NEGATIVE mg/dL
Leukocytes,Ua: NEGATIVE
Nitrite: POSITIVE — AB
Protein, ur: 100 mg/dL — AB
Specific Gravity, Urine: 1.024 (ref 1.005–1.030)
pH: 5 (ref 5.0–8.0)

## 2021-12-31 LAB — POC URINE PREG, ED: Preg Test, Ur: NEGATIVE

## 2021-12-31 LAB — TROPONIN I (HIGH SENSITIVITY)
Troponin I (High Sensitivity): 7 ng/L (ref <18)
Troponin I (High Sensitivity): 7 ng/L (ref <18)

## 2021-12-31 LAB — LIPASE, BLOOD: Lipase: 488 U/L — ABNORMAL HIGH (ref 11–51)

## 2021-12-31 MED ORDER — SODIUM CHLORIDE 0.9 % IV BOLUS
1000.0000 mL | Freq: Once | INTRAVENOUS | Status: AC
  Administered 2021-12-31: 1000 mL via INTRAVENOUS

## 2021-12-31 MED ORDER — IOHEXOL 300 MG/ML  SOLN
80.0000 mL | Freq: Once | INTRAMUSCULAR | Status: AC | PRN
  Administered 2021-12-31: 80 mL via INTRAVENOUS

## 2021-12-31 MED ORDER — MORPHINE SULFATE (PF) 4 MG/ML IV SOLN
4.0000 mg | Freq: Once | INTRAVENOUS | Status: AC
  Administered 2021-12-31: 4 mg via INTRAVENOUS
  Filled 2021-12-31: qty 1

## 2021-12-31 MED ORDER — ONDANSETRON HCL 4 MG/2ML IJ SOLN
4.0000 mg | Freq: Once | INTRAMUSCULAR | Status: AC
Start: 2021-12-31 — End: 2021-12-31
  Administered 2021-12-31: 4 mg via INTRAVENOUS
  Filled 2021-12-31: qty 2

## 2021-12-31 MED ORDER — ONDANSETRON HCL 4 MG PO TABS: 4.0000 mg | ORAL_TABLET | Freq: Three times a day (TID) | ORAL | 0 refills | Status: AC | PRN

## 2021-12-31 MED ORDER — NITROFURANTOIN MONOHYD MACRO 100 MG PO CAPS: 100.0000 mg | ORAL_CAPSULE | Freq: Two times a day (BID) | ORAL | 0 refills | Status: AC

## 2021-12-31 MED ORDER — ONDANSETRON HCL 4 MG/2ML IJ SOLN
4.0000 mg | Freq: Once | INTRAMUSCULAR | Status: AC
  Administered 2021-12-31: 4 mg via INTRAVENOUS
  Filled 2021-12-31: qty 2

## 2021-12-31 MED ORDER — OXYCODONE HCL 5 MG PO TABS: 5.0000 mg | ORAL_TABLET | Freq: Four times a day (QID) | ORAL | 0 refills | Status: AC | PRN

## 2021-12-31 NOTE — ED Provider Triage Note (Signed)
Emergency Medicine Provider Triage Evaluation Note  Brandi Oneal , a 51 y.o. female  was evaluated in triage.  Pt complains of abdominal pain.   Had pancreatitis 1-2 years ago and feels similar.    Review of Systems  Positive: N/V x 2 days.   Negative: No diarrhea   Physical Exam  There were no vitals taken for this visit. Gen:   Awake, no distress   Resp:  Normal effort  MSK:   Moves extremities without difficulty  Other:  Abdomen distended and diffusely tender but greater tenderness LUQ area.    Medical Decision Making  Medically screening exam initiated at 2:14 PM.  Appropriate orders placed.  Dollene Primrose was informed that the remainder of the evaluation will be completed by another provider, this initial triage assessment does not replace that evaluation, and the importance of remaining in the ED until their evaluation is complete.     Tommi Rumps, PA-C 12/31/21 1420

## 2021-12-31 NOTE — ED Provider Notes (Signed)
[facility] Provider Note    Event Date/Time   First MD Initiated Contact with Patient 12/31/21 1539     (approximate)   History   Abdominal Pain   HPI  COPELYN WIDMER is a 51 y.o. female  who, per discharge summary dated 12/04/20 had admission for idiopathic pancreatitis, who presents to the emergency department today for epigastric abdominal pain.  The patient states that the symptoms started 2 days ago.  They have been constant since then.  The pain is severe.  She has had decreased appetite and has had some nausea and vomiting with this.  She denies any fevers.  Denies any unusual ingestions.  Denies any recent alcohol use.  Denies any new medication.  Physical Exam   Triage Vital Signs: ED Triage Vitals  Enc Vitals Group     BP 12/31/21 1415 (!) 141/79     Pulse Rate 12/31/21 1415 88     Resp 12/31/21 1415 20     Temp 12/31/21 1415 98 F (36.7 C)     Temp Source 12/31/21 1415 Oral     SpO2 12/31/21 1415 97 %     Weight 12/31/21 1414 174 lb (78.9 kg)     Height 12/31/21 1414 5\' 2"  (1.575 m)     Head Circumference --      Peak Flow --      Pain Score 12/31/21 1414 10     Pain Loc --      Pain Edu? --      Excl. in GC? --     Most recent vital signs: Vitals:   12/31/21 1415  BP: (!) 141/79  Pulse: 88  Resp: 20  Temp: 98 F (36.7 C)  SpO2: 97%   General: Awake, alert, oriented. CV:  Good peripheral perfusion. Regular rate and rhythm. Resp:  Normal effort. Lungs clear. Abd:  No distention. Tender to palpation in the epigastric region.    ED Results / Procedures / Treatments   Labs (all labs ordered are listed, but only abnormal results are displayed) Labs Reviewed  LIPASE, BLOOD - Abnormal; Notable for the following components:      Result Value   Lipase 488 (*)    All other components within normal limits  COMPREHENSIVE METABOLIC PANEL - Abnormal; Notable for the following components:   Glucose, Bld 158 (*)    Calcium 8.7  (*)    Albumin 3.3 (*)    All other components within normal limits  CBC - Abnormal; Notable for the following components:   WBC 10.8 (*)    HCT 35.7 (*)    MCV 74.8 (*)    MCH 25.6 (*)    All other components within normal limits  URINALYSIS, ROUTINE W REFLEX MICROSCOPIC - Abnormal; Notable for the following components:   Color, Urine YELLOW (*)    APPearance HAZY (*)    Hgb urine dipstick SMALL (*)    Protein, ur 100 (*)    Nitrite POSITIVE (*)    Bacteria, UA MANY (*)    All other components within normal limits  POC URINE PREG, ED  TROPONIN I (HIGH SENSITIVITY)  TROPONIN I (HIGH SENSITIVITY)     EKG  I, 01/02/22, attending physician, personally viewed and interpreted this EKG  EKG Time: 1417 Rate: 89 Rhythm: normal sinus rhythm Axis: normal Intervals: qtc 430 QRS: narrow ST changes: no st elevation Impression: normal ekg  RADIOLOGY I independently interpreted and visualized the CT abd/pel. My interpretation:  No free air Radiology interpretation:  IMPRESSION:  1. Findings compatible with acute pancreatitis.  2. Aortic Atherosclerosis (ICD10-I70.0).    PROCEDURES:  Critical Care performed: No  Procedures   MEDICATIONS ORDERED IN ED: Medications - No data to display   IMPRESSION / MDM / ASSESSMENT AND PLAN / ED COURSE  I reviewed the triage vital signs and the nursing notes.                              Differential diagnosis includes, but is not limited to, pancreatitis, gastritis, perforation.  Patient's presentation is most consistent with acute presentation with potential threat to life or bodily function.  Patient presented to the emergency department today because of concerns for epigastric pain.  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  Lipase was elevated today.  CT scan was obtained which is consistent with acute pancreatitis however no pseudocyst, evidence of necrosis or abscess noted.  Patient was given IV fluids and  pain medication here in the emergency department.  She was able to tolerate some p.o.  I had a discussion with the patient about admission versus trying to manage this at her house.  At this time patient felt comfortable and opted to try managing as an outpatient.  I think this is reasonable.  Will give patient prescription for pain medication as well as nausea medication.  Discussed strict return cautions with the patient.  Additionally urine is concerning for urinary tract infection, will give antibiotics. FINAL CLINICAL IMPRESSION(S) / ED DIAGNOSES   Final diagnoses:  Acute pancreatitis, unspecified complication status, unspecified pancreatitis type  Lower urinary tract infectious disease        Note:  This document was prepared using Dragon voice recognition software and may include unintentional dictation errors.    Phineas Semen, MD 12/31/21 2104

## 2021-12-31 NOTE — Discharge Instructions (Addendum)
Please stick to a clear liquid diet for the next 48 hours and then you can slowly progress your diet as you improve. If you have any worsening pain, lack of improvement, persistent vomiting, fevers, or any other new or concerning symptoms please return to the emergency department.

## 2021-12-31 NOTE — ED Triage Notes (Signed)
To triage via wheelchair with c/o epigastric abd pain, N/V since Wednesday. Thinks it may be pancreatitis. Provider in triage room for [facility] at this time

## 2022-06-20 ENCOUNTER — Other Ambulatory Visit: Payer: Self-pay

## 2022-06-20 ENCOUNTER — Encounter: Payer: Self-pay | Admitting: *Deleted

## 2022-06-20 ENCOUNTER — Emergency Department
Admission: EM | Admit: 2022-06-20 | Discharge: 2022-06-20 | Disposition: A | Discharge status: Home / Self Care | Payer: Medicare Other | Attending: Student in an Organized Health Care Education/Training Program | Admitting: Student in an Organized Health Care Education/Training Program

## 2022-06-20 DIAGNOSIS — S61214A Laceration without foreign body of right ring finger without damage to nail, initial encounter: Secondary | ICD-10-CM | POA: Insufficient documentation

## 2022-06-20 DIAGNOSIS — Z23 Encounter for immunization: Secondary | ICD-10-CM | POA: Diagnosis not present

## 2022-06-20 DIAGNOSIS — W268XXA Contact with other sharp object(s), not elsewhere classified, initial encounter: Secondary | ICD-10-CM | POA: Diagnosis not present

## 2022-06-20 DIAGNOSIS — Y9389 Activity, other specified: Secondary | ICD-10-CM | POA: Diagnosis not present

## 2022-06-20 DIAGNOSIS — Y92009 Unspecified place in unspecified non-institutional (private) residence as the place of occurrence of the external cause: Secondary | ICD-10-CM | POA: Diagnosis not present

## 2022-06-20 MED ORDER — TETANUS-DIPHTH-ACELL PERTUSSIS 5-2.5-18.5 LF-MCG/0.5 IM SUSY
0.5000 mL | PREFILLED_SYRINGE | Freq: Once | INTRAMUSCULAR | Status: AC
  Administered 2022-06-20: 0.5 mL via INTRAMUSCULAR
  Filled 2022-06-20: qty 0.5

## 2022-06-20 NOTE — ED Triage Notes (Signed)
Pt has a laceration to right ring finger.  Pt cut finger on mandolin slicing onions.   Bleeding controlled  pt alert

## 2022-06-20 NOTE — ED Provider Notes (Signed)
[facility] Provider Note  Patient Contact: 8:29 PM (approximate)   History   Laceration   HPI  Brandi Oneal is a 52 y.o. female who presents the emergency department complaining of laceration to the ring finger of the right hand.  Patient had been using a mandolin at home.  Patient has bleeding mostly controlled with direct pressure though it does return when pressure is lifted.  No other injury or complaint.  Unsure last tetanus shot.     Physical Exam   Triage Vital Signs: ED Triage Vitals  Enc Vitals Group     BP 06/20/22 1932 (!) 162/109     Pulse Rate 06/20/22 1932 98     Resp 06/20/22 1932 17     Temp 06/20/22 1932 98.3 F (36.8 C)     Temp Source 06/20/22 1932 Oral     SpO2 06/20/22 1932 98 %     Weight 06/20/22 1929 175 lb (79.4 kg)     Height 06/20/22 1929 5\' 1"  (1.549 m)     Head Circumference --      Peak Flow --      Pain Score 06/20/22 1929 8     Pain Loc --      Pain Edu? --      Excl. in [place]? --     Most recent vital signs: Vitals:   06/20/22 1932  BP: (!) 162/109  Pulse: 98  Resp: 17  Temp: 98.3 F (36.8 C)  SpO2: 98%     General: Alert and in no acute distress.  Cardiovascular:  Good peripheral perfusion Respiratory: Normal respiratory effort without tachypnea or retractions. Lungs CTAB.  Musculoskeletal: Full range of motion to all extremities.  She is a little drowsy but no focal laceration.  Avulsion type laceration.  Bleeding is controlled direct pressure returns with removal of pressure.  Full range of motion preserved.  No injury to the fingernail. Neurologic:  No gross focal neurologic deficits are appreciated.  Skin:   No rash noted Other:   ED Results / Procedures / Treatments   Labs (all labs ordered are listed, but only abnormal results are displayed) Labs Reviewed - No data to display   EKG     RADIOLOGY    No results found.  PROCEDURES:   Critical Care performed:  No  ..Laceration Repair  Date/Time: 06/20/2022 8:35 PM  Performed by: Darletta Moll, PA-C Authorized by: Darletta Moll, PA-C   Consent:    Consent obtained:  Verbal   Consent given by:  Patient   Risks discussed:  Pain and need for additional repair Universal protocol:    Immediately prior to procedure, a time out was called: yes     Patient identity confirmed:  Verbally with patient Anesthesia:    Anesthesia method:  None Laceration details:    Location:  Finger   Finger location:  R ring finger   Length (cm):  1 Exploration:    Hemostasis achieved with:  Direct pressure Repair type:    Repair type:  Simple Post-procedure details:    Dressing:  Tube gauze, sterile dressing, non-adherent dressing and bulky dressing   Procedure completion:  Tolerated well, no immediate complications Comments:     Patient with avulsion type laceration to the right ring finger.  Areas cleansed, Surgicel dressing applied with good hemostasis.  Bulky dressing with tube gauze placed.    MEDICATIONS ORDERED IN ED: Medications  Tdap (BOOSTRIX) injection 0.5 mL (has no administration  in time range)     IMPRESSION / MDM / ASSESSMENT AND PLAN / ED COURSE  I reviewed the triage vital signs and the nursing notes.                                 Differential diagnosis includes, but is not limited to, finger laceration  Patient's presentation is most consistent with acute presentation with potential threat to life or bodily function.   Patient's diagnosis is consistent with finger laceration.  Patient presented to the emergency department with laceration to the ring finger of the right hand.  Patient was using a mandolin cutter with an avulsion type laceration.  Edges were not able to be approximated given the nature of injury.  As such area was cleansed, Surgicel dressing applied for hemostasis.  Patient will be given tetanus shot.  Wound care instructions discussed with patient.   Follow-up primary care as needed.. Patient is given ED precautions to return to the ED for any worsening or new symptoms.     FINAL CLINICAL IMPRESSION(S) / ED DIAGNOSES   Final diagnoses:  None     Rx / DC Orders   ED Discharge Orders     None        Note:  This document was prepared using Dragon voice recognition software and may include unintentional dictation errors.   Brynda Peon 06/20/22 2039    Merlyn Lot, MD 06/20/22 2158

## 2022-09-01 IMAGING — CT CT ABD-PELV W/ CM
2 of 5 series · 15 of 46 positions shown, 17 images · IV contrast (APPLIED)
Comparison: CT abdomen pelvis dated 11/09/2009.

CLINICAL DATA: 50-year-old female with pancreatitis.

EXAM:
CT ABDOMEN AND PELVIS WITH CONTRAST
TECHNIQUE: Multidetector CT imaging of the abdomen and pelvis was performed
using the standard protocol following bolus administration of
intravenous contrast.
CONTRAST:  100mL OMNIPAQUE IOHEXOL 300 MG/ML  SOLN

[Series 2: routine abd/pel with · axial · 0.77mm/px · z∈[-988,-558]mm · 12 of 98 slices shown, 14 images]
[im 6/98  soft-tissue]
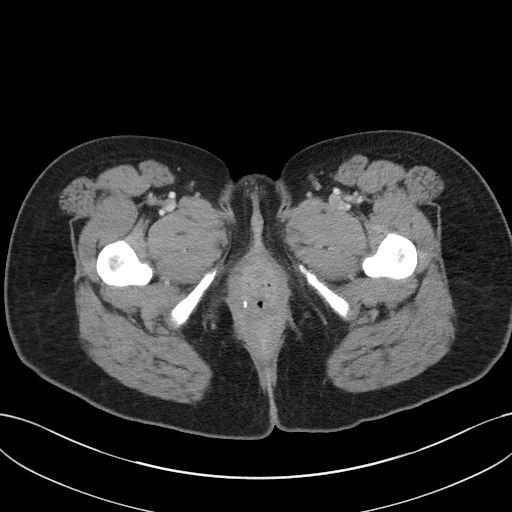
[im 6/98  bone]
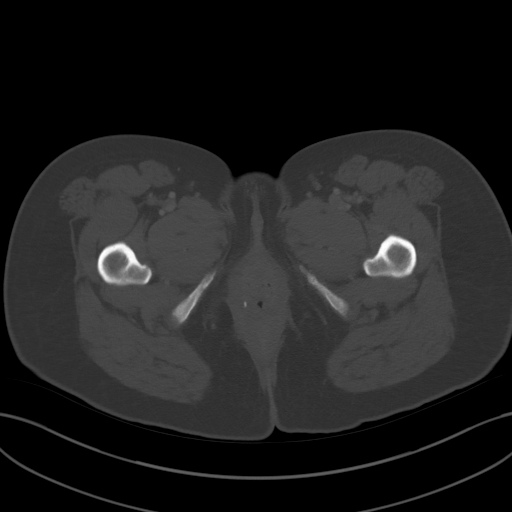
[im 16/98  soft-tissue]
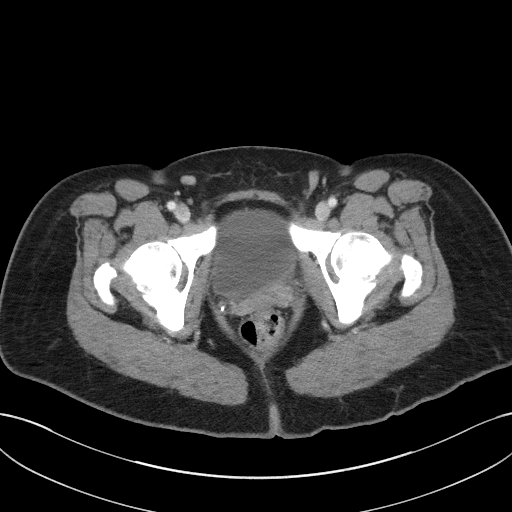
[im 21/98  soft-tissue]
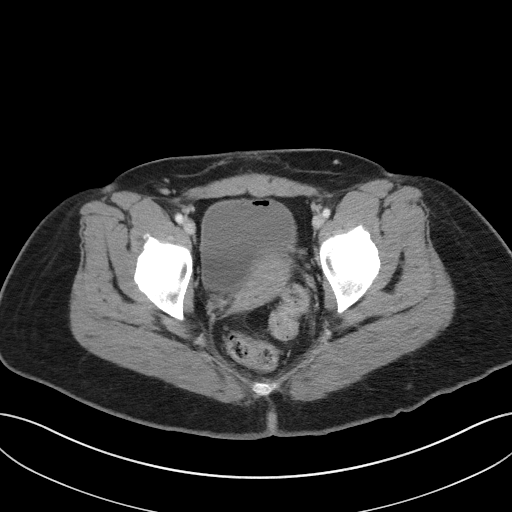
[im 31/98  soft-tissue]
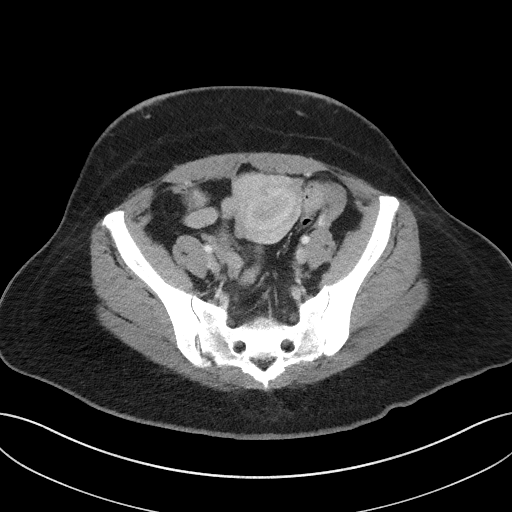
[im 36/98  soft-tissue]
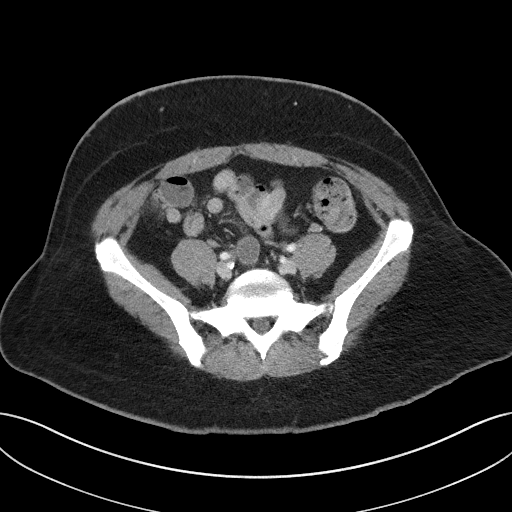
[im 46/98  soft-tissue]
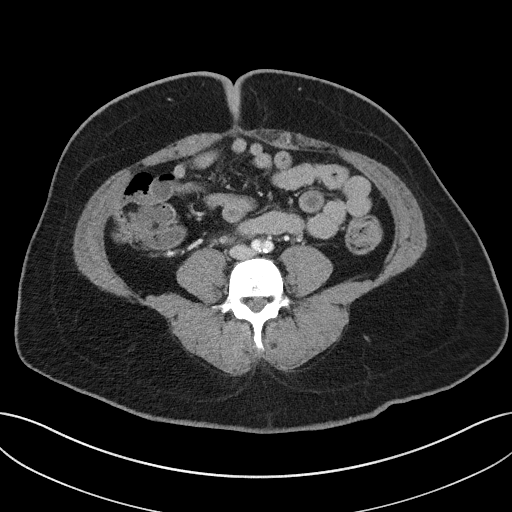
[im 52/98  soft-tissue]
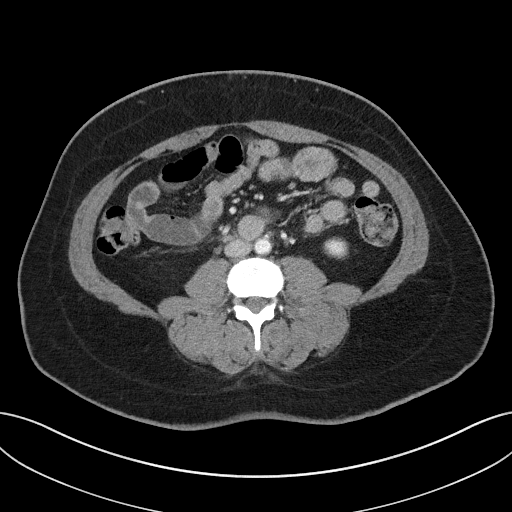
[im 62/98  soft-tissue]
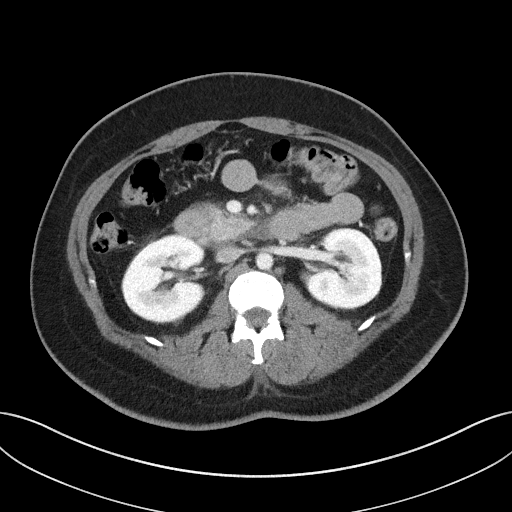
[im 67/98  soft-tissue]
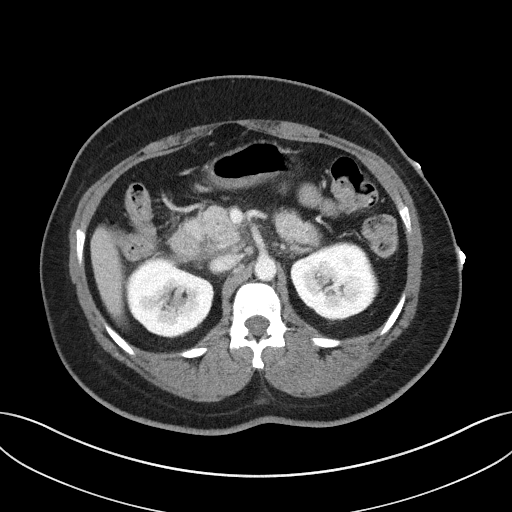
[im 67/98  bone]
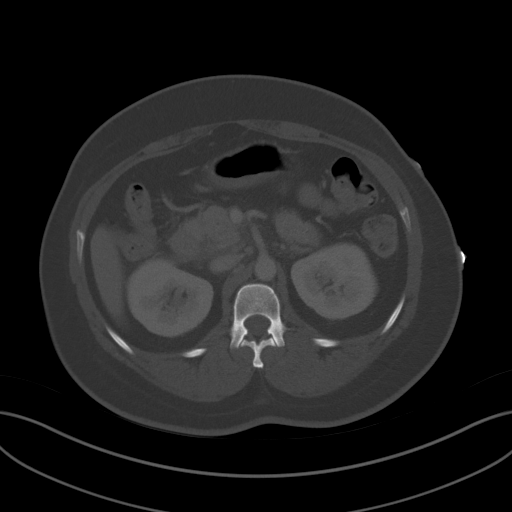
[im 77/98  soft-tissue]
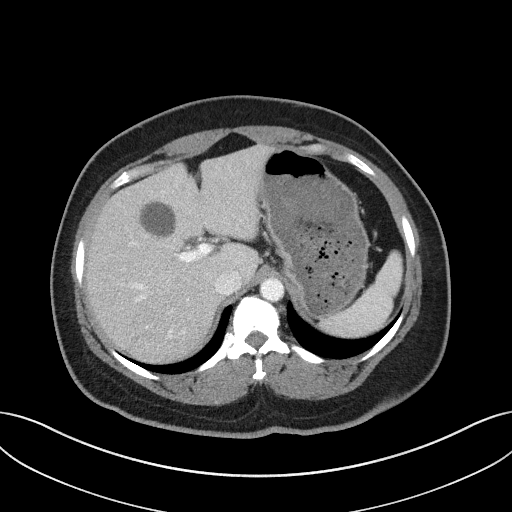
[im 82/98  soft-tissue]
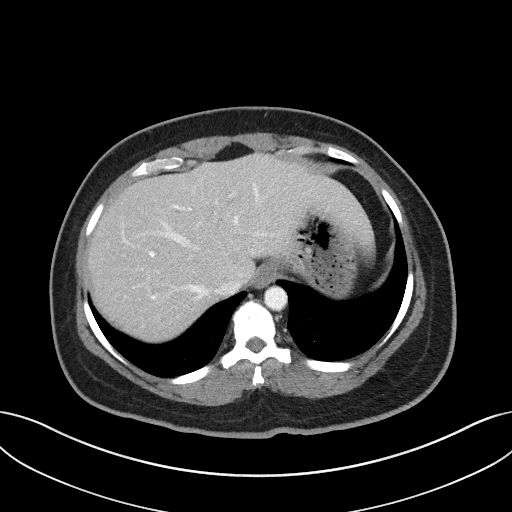
[im 92/98  soft-tissue]
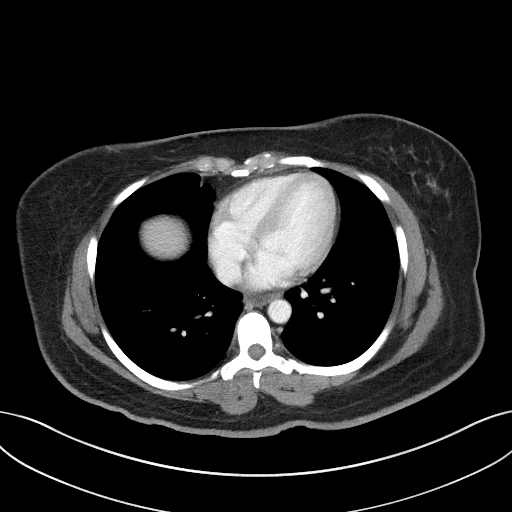

[Series 5: coronal st · coronal · 0.73mm/px · 3 of 90 slices shown]
[im 30/90  soft-tissue]
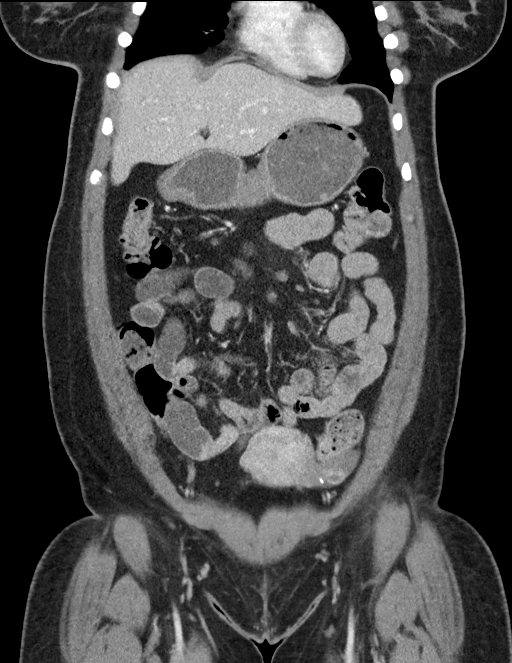
[im 40/90  soft-tissue]
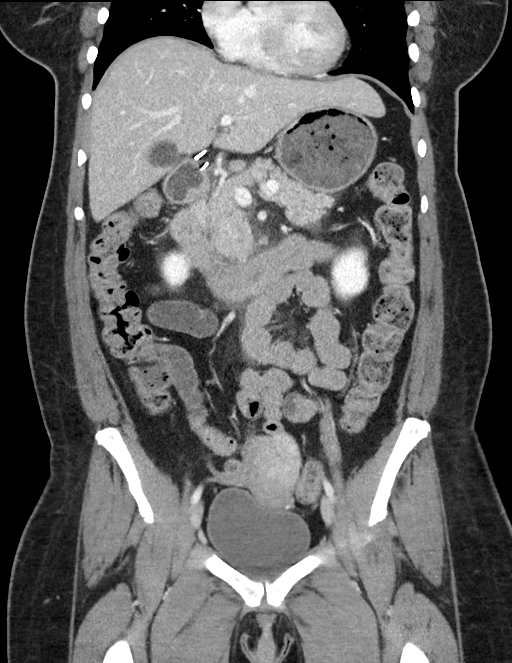
[im 50/90  soft-tissue]
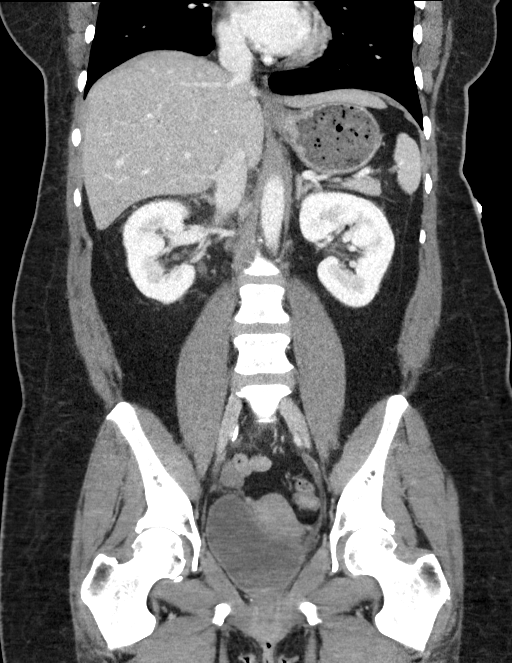

[15 of 46 positions shown; findings below may reference images not displayed]

FINDINGS: Lower chest: The visualized lung bases are clear.

No intra-abdominal free air or free fluid.

Hepatobiliary: There is a 3.3 cm cyst in the right lobe of the
liver. The liver is otherwise unremarkable. No intrahepatic biliary
dilatation. Cholecystectomy. No retained calcified stone noted in
the central CBD.

Pancreas: There is inflammatory changes of the pancreas consistent
with acute pancreatitis. No drainable fluid collection/abscess or
pseudocyst.

Spleen: Normal in size without focal abnormality.

Adrenals/Urinary Tract: The adrenal glands unremarkable. There is no
hydronephrosis on either side. There is symmetric enhancement and
excretion of contrast by both kidneys. The visualized ureters appear
unremarkable. Small pocket of air within the urinary bladder may be
related to recent instrumentation. Correlation with urinalysis
recommended to exclude cystitis.

Stomach/Bowel: There is moderate stool throughout the colon. There
is no bowel obstruction or active inflammation. The appendix is not
visualized with certainty. No inflammatory changes identified in the
right lower quadrant.

Vascular/Lymphatic: The abdominal aorta and IVC are unremarkable. No
portal venous gas. Several top-normal peripancreatic lymph nodes,
reactive.

Reproductive: The uterus is anteverted and grossly unremarkable.
There is a 15 mm left adnexal corpus luteum. The right ovary is
unremarkable.

Other: There is induration of the umbilical fat. Correlation with
clinical exam recommended to exclude incarceration. No fluid
collection.

Musculoskeletal: No acute or significant osseous findings.
IMPRESSION: 1. Acute pancreatitis. No abscess or pseudocyst.
2. No bowel obstruction.
3. Small pocket of air within the urinary bladder may be related to
recent instrumentation. Correlation with urinalysis recommended to
exclude cystitis.

## 2023-09-19 ENCOUNTER — Other Ambulatory Visit: Payer: Self-pay | Admitting: Family Medicine

## 2023-09-19 DIAGNOSIS — Z1231 Encounter for screening mammogram for malignant neoplasm of breast: Secondary | ICD-10-CM

## 2024-07-03 ENCOUNTER — Other Ambulatory Visit: Payer: Self-pay

## 2024-07-03 DIAGNOSIS — L508 Other urticaria: Secondary | ICD-10-CM | POA: Insufficient documentation

## 2024-07-03 DIAGNOSIS — L299 Pruritus, unspecified: Secondary | ICD-10-CM | POA: Diagnosis present

## 2024-07-03 MED ORDER — DIPHENHYDRAMINE HCL 25 MG PO CAPS
50.0000 mg | ORAL_CAPSULE | Freq: Once | ORAL | Status: AC
  Administered 2024-07-03: 50 mg via ORAL
  Filled 2024-07-03: qty 2

## 2024-07-03 NOTE — ED Triage Notes (Signed)
 Pt reports she developed hives earlier today, pt denies known allergies.

## 2024-07-04 ENCOUNTER — Emergency Department
Admission: EM | Admit: 2024-07-04 | Discharge: 2024-07-04 | Disposition: A | Discharge status: Home / Self Care | Attending: Emergency Medicine | Admitting: Emergency Medicine

## 2024-07-04 DIAGNOSIS — L508 Other urticaria: Secondary | ICD-10-CM

## 2024-07-04 NOTE — ED Provider Notes (Signed)
 "  [facility] Provider Note    Event Date/Time   First MD Initiated Contact with Patient 07/04/24 0126     (approximate)   History   Urticaria   HPI  Brandi Oneal is a 54 y.o. female   Past medical history of no significant past medical history who presents to Emergency Department with welts throughout her body, itchy, with no known exposures.  Took Benadryl  in the ED and they have since resolved.    She had no shortness of breath, oral swelling, or any other associated complaints.    She does have dry skin.     External Medical Documents Reviewed: Previous outpatient notes      Physical Exam   Triage Vital Signs: ED Triage Vitals  Encounter Vitals Group     BP 07/03/24 2327 (!) 146/77     Girls Systolic BP Percentile --      Girls Diastolic BP Percentile --      Boys Systolic BP Percentile --      Boys Diastolic BP Percentile --      Pulse Rate 07/03/24 2327 100     Resp 07/03/24 2327 18     Temp 07/03/24 2327 98.2 F (36.8 C)     Temp src --      SpO2 07/03/24 2327 97 %     Weight 07/03/24 2326 176 lb (79.8 kg)     Height 07/03/24 2326 5' 2 (1.575 m)     Head Circumference --      Peak Flow --      Pain Score 07/03/24 2326 6     Pain Loc --      Pain Education --      Exclude from Growth Chart --     Most recent vital signs: Vitals:   07/03/24 2327  BP: (!) 146/77  Pulse: 100  Resp: 18  Temp: 98.2 F (36.8 C)  SpO2: 97%    General: Awake, no distress.  CV:  Good peripheral perfusion.  Resp:  Normal effort.  Abd:  No distention.  Other:  No skin rash, intraoral swelling, and breathing comfortably on room air, slightly hypertensive otherwise vital signs normal.   ED Results / Procedures / Treatments   Labs (all labs ordered are listed, but only abnormal results are displayed) Labs Reviewed - No data to display    PROCEDURES:  Critical Care performed: No  Procedures   MEDICATIONS ORDERED IN  ED: Medications  diphenhydrAMINE  (BENADRYL ) capsule 50 mg (50 mg Oral Given 07/03/24 2328)    IMPRESSION / MDM / ASSESSMENT AND PLAN / ED COURSE  I reviewed the triage vital signs and the nursing notes.                                Patient's presentation is most consistent with acute presentation with potential threat to life or bodily function.  Differential diagnosis includes, but is not limited to, acute urticaria, allergic reaction, considered but less likely dermatologic emergencies or anaphylaxis   MDM:    The described itchy rash that she experienced earlier that has now completely resolved with Benadryl  is most likely acute urticaria or hives.  She had no other systems involvement suggest anaphylaxis and certainly no evidence of airway obstruction looks very well now.  No known exposures that could have caused this.  I gave her anticipatory guidance should the symptoms recur to take second-generation  antihistamine as needed.  She does have dry skin throughout as well for which I recommended emollient.  Since she is now asymptomatic she is safe for discharge.  Return precautions given.         FINAL CLINICAL IMPRESSION(S) / ED DIAGNOSES   Final diagnoses:  Acute urticaria     Rx / DC Orders   ED Discharge Orders     None        Note:  This document was prepared using Dragon voice recognition software and may include unintentional dictation errors.    Cyrena Mylar, MD 07/04/24 [phone]  "

## 2024-07-04 NOTE — Discharge Instructions (Addendum)
 Find cetirizine at your local pharmacy and take daily for hives as needed.  Find Eucerin or similar skin emollient to help with your dry skin and use daily.  Thank you for choosing us  for your health care today!  Please see your primary doctor this week for a follow up appointment.   If you have any new, worsening, or unexpected symptoms call your doctor right away or come back to the emergency department for reevaluation.  It was my pleasure to care for you today.   Ginnie EDISON Cyrena, MD
